# Patient Record
Sex: Male | Born: 1945
Health system: Southern US, Community
[De-identification: ages and names within clinical notes are randomized; demographics above are authoritative.]

## PROBLEM LIST (undated history)

## (undated) DIAGNOSIS — E785 Hyperlipidemia, unspecified: Secondary | ICD-10-CM

## (undated) DIAGNOSIS — I255 Ischemic cardiomyopathy: Secondary | ICD-10-CM

## (undated) DIAGNOSIS — I259 Chronic ischemic heart disease, unspecified: Secondary | ICD-10-CM

## (undated) HISTORY — DX: Hyperlipidemia, unspecified: E78.5

## (undated) HISTORY — DX: Chronic ischemic heart disease, unspecified: I25.9

## (undated) HISTORY — DX: Ischemic cardiomyopathy: I25.5

---

## 2000-08-19 ENCOUNTER — Encounter: Payer: Self-pay | Admitting: *Deleted

## 2000-08-19 ENCOUNTER — Encounter: Admission: RE | Admit: 2000-08-19 | Discharge: 2000-08-19 | Payer: Self-pay | Admitting: *Deleted

## 2004-10-16 ENCOUNTER — Inpatient Hospital Stay (HOSPITAL_COMMUNITY): Admission: RE | Admit: 2004-10-16 | Discharge: 2004-10-19 | Payer: Self-pay | Admitting: Urology

## 2011-12-25 ENCOUNTER — Ambulatory Visit: Payer: Medicare Other | Attending: Internal Medicine | Admitting: Physical Therapy

## 2011-12-25 DIAGNOSIS — M542 Cervicalgia: Secondary | ICD-10-CM | POA: Insufficient documentation

## 2011-12-25 DIAGNOSIS — IMO0001 Reserved for inherently not codable concepts without codable children: Secondary | ICD-10-CM | POA: Insufficient documentation

## 2011-12-25 DIAGNOSIS — M2569 Stiffness of other specified joint, not elsewhere classified: Secondary | ICD-10-CM | POA: Insufficient documentation

## 2011-12-25 DIAGNOSIS — R293 Abnormal posture: Secondary | ICD-10-CM | POA: Insufficient documentation

## 2011-12-29 ENCOUNTER — Ambulatory Visit: Payer: Medicare Other | Admitting: Rehabilitation

## 2012-01-02 ENCOUNTER — Ambulatory Visit: Payer: Medicare Other

## 2012-01-06 ENCOUNTER — Ambulatory Visit: Payer: Medicare Other | Admitting: Rehabilitative and Restorative Service Providers"

## 2012-01-08 ENCOUNTER — Ambulatory Visit: Payer: Medicare Other | Attending: Internal Medicine | Admitting: Physical Therapy

## 2012-01-08 DIAGNOSIS — R293 Abnormal posture: Secondary | ICD-10-CM | POA: Insufficient documentation

## 2012-01-08 DIAGNOSIS — M2569 Stiffness of other specified joint, not elsewhere classified: Secondary | ICD-10-CM | POA: Insufficient documentation

## 2012-01-08 DIAGNOSIS — IMO0001 Reserved for inherently not codable concepts without codable children: Secondary | ICD-10-CM | POA: Insufficient documentation

## 2012-01-08 DIAGNOSIS — M542 Cervicalgia: Secondary | ICD-10-CM | POA: Insufficient documentation

## 2012-01-13 ENCOUNTER — Ambulatory Visit: Payer: Medicare Other | Admitting: Physical Therapy

## 2012-01-16 ENCOUNTER — Ambulatory Visit: Payer: Medicare Other | Admitting: Physical Therapy

## 2012-01-20 ENCOUNTER — Ambulatory Visit: Payer: Medicare Other | Admitting: Physical Therapy

## 2012-01-23 ENCOUNTER — Ambulatory Visit: Payer: Medicare Other | Admitting: Physical Therapy

## 2012-01-27 ENCOUNTER — Ambulatory Visit: Payer: Medicare Other | Admitting: Physical Therapy

## 2012-01-29 ENCOUNTER — Ambulatory Visit: Payer: Medicare Other | Admitting: Physical Therapy

## 2012-02-04 ENCOUNTER — Ambulatory Visit: Payer: Medicare Other | Admitting: Physical Therapy

## 2012-02-06 ENCOUNTER — Encounter: Payer: Medicare Other | Admitting: Physical Therapy

## 2017-03-30 ENCOUNTER — Ambulatory Visit (INDEPENDENT_AMBULATORY_CARE_PROVIDER_SITE_OTHER): Payer: Medicare Other | Admitting: Orthopaedic Surgery

## 2017-03-30 DIAGNOSIS — M722 Plantar fascial fibromatosis: Secondary | ICD-10-CM | POA: Insufficient documentation

## 2017-03-30 MED ORDER — LIDOCAINE HCL 1 % IJ SOLN
1.0000 mL | INTRAMUSCULAR | Status: AC | PRN
  Administered 2017-03-30: 1 mL

## 2017-03-30 MED ORDER — METHYLPREDNISOLONE ACETATE 40 MG/ML IJ SUSP
40.0000 mg | INTRAMUSCULAR | Status: AC | PRN
  Administered 2017-03-30: 40 mg

## 2017-03-30 NOTE — Progress Notes (Signed)
   Office Visit Note   Patient: Anthony Shepherd           Date of Birth: 03/12/1946           MRN: [id number] Visit Date: 03/30/2017              Requested by: No referring provider defined for this encounter. PCP: Kirby FunkGriffin, John, MD   Assessment & Plan: Visit Diagnoses:  1. Plantar fasciitis of left foot     Plan: His clinical exam as well as signs and symptoms are consistent with plantar fasciitis of left heel. I talked about trying steroid injections. He is agreeable to this. Also showed him stretching exercises to try. All questions were encouraged and answered. He tolerated the injection well. He'll follow-up as needed but I would consider another injection into 3 months it is still bothering him.  Follow-Up Instructions: Return if symptoms worsen or fail to improve.   Orders:  Orders Placed This Encounter  Procedures  . Foot Injection   No orders of the defined types were placed in this encounter.     Procedures: Foot Inj Date/Time: 03/30/2017 11:16 AM Performed by: Kathryne HitchBLACKMAN, Sharri Loya Y Authorized by: Doneen PoissonBLACKMAN, Rashell Shambaugh Y   Condition: Plantar Fasciitis   Location: left plantar fascia muscle   Medications:  1 mL lidocaine 1 %; 40 mg methylPREDNISolone acetate 40 MG/ML     Clinical Data: No additional findings.   Subjective: No chief complaint on file. Patient is a very pleasant 71-year-old who comes in with left heel pain. It hurts on the bottom his foot to the first step in the morning when he gets out of bed is most painful. Denies a numbness and tingling in his foot and he denies any injury. He is not a diabetic. He's been working on rolling a water bottle his frozen understood try to get a better. Is been going on since about May of this year.  HPI  Review of Systems  He currently denies any headache, chest pain, short breath, fever, chills, nausea, vomiting. Objective: Vital Signs: There were no vitals taken for this visit.  Physical Exam He is  alert or 3 and in no acute distress Ortho Exam Examination of his left foot shows pain only a little plantar fascia at the heel. He does radiate the Achilles occasion but his Achilles is intact. His foot has a normal-appearing arch. His neurovascular stable his foot. His ankle range of motion is full. Specialty Comments:  No specialty comments available.  Imaging: No results found.   PMFS History: Patient Active Problem List   Diagnosis Date Noted  . Plantar fasciitis of left foot 03/30/2017   No past medical history on file.  No family history on file.  No past surgical history on file. Social History   Occupational History  . Not on file.   Social History Main Topics  . Smoking status: Not on file  . Smokeless tobacco: Not on file  . Alcohol use Not on file  . Drug use: Unknown  . Sexual activity: Not on file

## 2017-07-14 ENCOUNTER — Other Ambulatory Visit: Payer: Self-pay | Admitting: Family Medicine

## 2017-07-14 ENCOUNTER — Ambulatory Visit
Admission: RE | Admit: 2017-07-14 | Discharge: 2017-07-14 | Disposition: A | Discharge status: Home / Self Care | Payer: Worker's Compensation | Source: Ambulatory Visit | Attending: Family Medicine | Admitting: Family Medicine

## 2017-07-14 DIAGNOSIS — M7989 Other specified soft tissue disorders: Secondary | ICD-10-CM

## 2017-07-14 DIAGNOSIS — R9389 Abnormal findings on diagnostic imaging of other specified body structures: Secondary | ICD-10-CM | POA: Insufficient documentation

## 2018-05-30 ENCOUNTER — Inpatient Hospital Stay (HOSPITAL_COMMUNITY)
Admission: EM | Admit: 2018-05-30 | Discharge: 2018-06-01 | DRG: 247 | Disposition: A | Discharge status: Home / Self Care | Payer: Medicare Other | Attending: Cardiology | Admitting: Cardiology

## 2018-05-30 ENCOUNTER — Emergency Department (HOSPITAL_COMMUNITY): Payer: Medicare Other

## 2018-05-30 ENCOUNTER — Encounter (HOSPITAL_COMMUNITY): Payer: Self-pay | Admitting: Emergency Medicine

## 2018-05-30 ENCOUNTER — Other Ambulatory Visit: Payer: Self-pay

## 2018-05-30 DIAGNOSIS — I10 Essential (primary) hypertension: Secondary | ICD-10-CM | POA: Diagnosis present

## 2018-05-30 DIAGNOSIS — I214 Non-ST elevation (NSTEMI) myocardial infarction: Secondary | ICD-10-CM

## 2018-05-30 DIAGNOSIS — I255 Ischemic cardiomyopathy: Secondary | ICD-10-CM | POA: Diagnosis present

## 2018-05-30 DIAGNOSIS — R079 Chest pain, unspecified: Secondary | ICD-10-CM | POA: Diagnosis not present

## 2018-05-30 DIAGNOSIS — I2510 Atherosclerotic heart disease of native coronary artery without angina pectoris: Secondary | ICD-10-CM | POA: Diagnosis present

## 2018-05-30 DIAGNOSIS — Z955 Presence of coronary angioplasty implant and graft: Secondary | ICD-10-CM

## 2018-05-30 DIAGNOSIS — I249 Acute ischemic heart disease, unspecified: Secondary | ICD-10-CM | POA: Diagnosis present

## 2018-05-30 DIAGNOSIS — H409 Unspecified glaucoma: Secondary | ICD-10-CM | POA: Diagnosis present

## 2018-05-30 DIAGNOSIS — I240 Acute coronary thrombosis not resulting in myocardial infarction: Secondary | ICD-10-CM

## 2018-05-30 LAB — BASIC METABOLIC PANEL
Anion gap: 9 (ref 5–15)
BUN: 13 mg/dL (ref 8–23)
CALCIUM: 8.6 mg/dL — AB (ref 8.9–10.3)
CO2: 26 mmol/L (ref 22–32)
CREATININE: 1 mg/dL (ref 0.61–1.24)
Chloride: 105 mmol/L (ref 98–111)
GFR calc non Af Amer: >60 mL/min (ref >60)
GLUCOSE: 130 mg/dL — AB (ref 70–99)
Potassium: 4.1 mmol/L (ref 3.5–5.1)
Sodium: 140 mmol/L (ref 135–145)

## 2018-05-30 LAB — TROPONIN I
TROPONIN I: 0.4 ng/mL — AB (ref <0.03)
Troponin I: 8.91 ng/mL (ref <0.03)

## 2018-05-30 LAB — CBC
HCT: 45.4 % (ref 39.0–52.0)
HEMOGLOBIN: 15.1 g/dL (ref 13.0–17.0)
MCH: 33 pg (ref 26.0–34.0)
MCHC: 33.3 g/dL (ref 30.0–36.0)
MCV: 99.1 fL (ref 78.0–100.0)
PLATELETS: 223 10*3/uL (ref 150–400)
RBC: 4.58 MIL/uL (ref 4.22–5.81)
RDW: 13 % (ref 11.5–15.5)
WBC: 11 10*3/uL — AB (ref 4.0–10.5)

## 2018-05-30 LAB — CBG MONITORING, ED: GLUCOSE-CAPILLARY: 84 mg/dL (ref 70–99)

## 2018-05-30 LAB — I-STAT TROPONIN, ED: Troponin i, poc: 8.84 ng/mL (ref 0.00–0.08)

## 2018-05-30 LAB — POCT I-STAT TROPONIN I: TROPONIN I, POC: 7.4 ng/mL — AB (ref 0.00–0.08)

## 2018-05-30 MED ORDER — ACETAMINOPHEN 325 MG PO TABS
650.0000 mg | ORAL_TABLET | ORAL | Status: DC | PRN
  Administered 2018-05-31: 650 mg via ORAL
  Filled 2018-05-30: qty 2

## 2018-05-30 MED ORDER — HEPARIN (PORCINE) IN NACL 100-0.45 UNIT/ML-% IJ SOLN: 12.0000 [IU]/kg/h | INTRAMUSCULAR | Status: DC

## 2018-05-30 MED ORDER — ASPIRIN 81 MG PO CHEW
324.0000 mg | CHEWABLE_TABLET | Freq: Once | ORAL | Status: AC
  Administered 2018-05-30: 324 mg via ORAL
  Filled 2018-05-30: qty 4

## 2018-05-30 MED ORDER — HEPARIN SODIUM (PORCINE) 5000 UNIT/ML IJ SOLN: 60.0000 [IU]/kg | Freq: Once | INTRAMUSCULAR | Status: DC

## 2018-05-30 MED ORDER — ASPIRIN EC 81 MG PO TBEC
81.0000 mg | DELAYED_RELEASE_TABLET | Freq: Every day | ORAL | Status: DC
  Administered 2018-06-01: 81 mg via ORAL
  Filled 2018-05-30: qty 1

## 2018-05-30 MED ORDER — NITROGLYCERIN 2 % TD OINT
0.5000 [in_us] | TOPICAL_OINTMENT | Freq: Once | TRANSDERMAL | Status: AC
  Administered 2018-05-30: 0.5 [in_us] via TOPICAL
  Filled 2018-05-30: qty 1

## 2018-05-30 MED ORDER — NITROGLYCERIN 0.4 MG SL SUBL: 0.4000 mg | SUBLINGUAL_TABLET | SUBLINGUAL | Status: DC | PRN

## 2018-05-30 MED ORDER — HEPARIN BOLUS VIA INFUSION
4000.0000 [IU] | Freq: Once | INTRAVENOUS | Status: AC
  Administered 2018-05-30: 4000 [IU] via INTRAVENOUS
  Filled 2018-05-30: qty 4000

## 2018-05-30 MED ORDER — ONDANSETRON HCL 4 MG/2ML IJ SOLN: 4.0000 mg | Freq: Four times a day (QID) | INTRAMUSCULAR | Status: DC | PRN

## 2018-05-30 MED ORDER — HEPARIN (PORCINE) IN NACL 100-0.45 UNIT/ML-% IJ SOLN
1050.0000 [IU]/h | INTRAMUSCULAR | Status: DC
  Administered 2018-05-30: 1100 [IU]/h via INTRAVENOUS
  Filled 2018-05-30: qty 250

## 2018-05-30 NOTE — ED Notes (Signed)
Pt states that he was sitting in church today when he had a sudden sharp left chest pain which came back after eating lunch.

## 2018-05-30 NOTE — ED Notes (Signed)
Dr Dalene SeltzerSchlossman informed of troponin results 8.84

## 2018-05-30 NOTE — H&P (Signed)
Cardiology Admission History and Physical:   Patient ID: Anthony RanchCharles M Higbie; MRN: [id number]; DOB: 01/03/1946   Admission date: 05/30/2018  Primary Care Provider:  Kirby FunkGriffin, John, MD Primary Cardiologist:  No primary care provider on file   Chief Complaint:  Chest pain  History of Present Illness:   Anthony Shepherd is a 72 y.o. male with a history of hypertension and glaucoma who presents with complaints of off and on episodes of chest pain. He recalls having an episode of substernal chest pain while at church earlier today. The pain radiated to the left arm. He also was diaphoretic and nauseous. He attempted to belch to relieve his symptoms. The symptoms waxed and waned. He was able to eat lunch. The chest pain increased in intensity afterwards. He did not endorse any orthopnea, paroxysmal nocturnal dyspnea or lower extremity edema. He did not have any palpitations, presyncope or syncope.  His exercise tolerance till recently was normal. He has not had any prior episodes of chest pain.  In the ED the Troponin was 0.4. The ECG revealed ST depressions in the inferolateral leads.   Past Medical History Hypertension Glaucoma  Past Surgical History Hernia repair   Medications Prior to Admission: Prior to Admission medications   Not on File     Allergies:   No Known Allergies   Social History:   Social History   Socioeconomic History  . Marital status: Married    Spouse name: Not on file  . Number of children: Not on file  . Years of education: Not on file  . Highest education level: Not on file  Occupational History  . Not on file  Social Needs  . Financial resource strain: Not on file  . Food insecurity:    Worry: Not on file    Inability: Not on file  . Transportation needs:    Medical: Not on file    Non-medical: Not on file  Tobacco Use  . Smoking status: Not on file  Substance and Sexual Activity  . Alcohol use: Not on file  . Drug use: Not on file  . Sexual  activity: Not on file  Lifestyle  . Physical activity:    Days per week: Not on file    Minutes per session: Not on file  . Stress: Not on file  Relationships  . Social connections:    Talks on phone: Not on file    Gets together: Not on file    Attends religious service: Not on file    Active member of club or organization: Not on file    Attends meetings of clubs or organizations: Not on file    Relationship status: Not on file  . Intimate partner violence:    Fear of current or ex partner: Not on file    Emotionally abused: Not on file    Physically abused: Not on file    Forced sexual activity: Not on file  Other Topics Concern  . Not on file  Social History Narrative  . Not on file     Family History:  The patient's family history is not on file.     Review of Systems: [y] = yes, [ ]  = no   . General: Weight gain [ ] ; Weight loss [ ] ; Anorexia [ ] ; Fatigue [ ] ; Fever [ ] ; Chills [ ] ; Weakness [ ]   . Cardiac: Chest pain/pressure [y]; Resting SOB [ ] ; Exertional SOB [ ] ; Orthopnea [ ] ; Pedal Edema [ ] ; Palpitations [ ] ;  Syncope [ ] ; Presyncope [ ] ; Paroxysmal nocturnal dyspnea[ ]   . Pulmonary: Cough [ ] ; Wheezing[ ] ; Hemoptysis[ ] ; Sputum [ ] ; Snoring [ ]   . GI: Vomiting[ ] ; Dysphagia[ ] ; Melena[ ] ; Hematochezia [ ] ; Heartburn[ ] ; Abdominal pain [ ] ; Constipation [ ] ; Diarrhea [ ] ; BRBPR [ ]   . GU: Hematuria[ ] ; Dysuria [ ] ; Nocturia[ ]   . Vascular: Pain in legs with walking [ ] ; Pain in feet with lying flat [ ] ; Non-healing sores [ ] ; Stroke [ ] ; TIA [ ] ; Slurred speech [ ] ;  . Neuro: Headaches[ ] ; Vertigo[ ] ; Seizures[ ] ; Paresthesias[ ] ;Blurred vision [ ] ; Diplopia [ ] ; Vision changes [ ]   . Ortho/Skin: Arthritis [ ] ; Joint pain [ ] ; Muscle pain [ ] ; Joint swelling [ ] ; Back Pain [ ] ; Rash [ ]   . Psych: Depression[ ] ; Anxiety[ ]   . Heme: Bleeding problems [ ] ; Clotting disorders [ ] ; Anemia [ ]   . Endocrine: Diabetes [ ] ; Thyroid dysfunction[ ]      Physical  Exam/Data:   Vitals:   05/30/18 1845 05/30/18 1900 05/30/18 1915 05/30/18 1930  BP: 114/77 119/86 (!) 131/91 (!) 152/99  Pulse: 62 67 70 86  Resp: 18 18 11 16   Temp:      SpO2: 100% 98% 100% 100%  Weight:  87.1 kg 87.1 kg   Height:  5' 10.5" (1.791 m) 5\' 11"  (1.803 m)    No intake or output data in the 24 hours ending 05/30/18 1958 Filed Weights   05/30/18 1900 05/30/18 1915  Weight: 87.1 kg 87.1 kg   Body mass index is 26.78 kg/m.  General:  Well nourished, well developed, in no acute distress HEENT: normal Lymph: no adenopathy Neck: no JVD Endocrine:  No thryomegaly Vascular: No carotid bruits; FA pulses 2+ bilaterally without bruits  Cardiac:  normal S1, S2; RRR; no murmur  Lungs:  clear to auscultation bilaterally, no wheezing, rhonchi or rales  Abd: soft, nontender, no hepatomegaly  Ext: no edema Musculoskeletal:  No deformities, BUE and BLE strength normal and equal Skin: warm and dry  Neuro:  CNs 2-12 intact, no focal abnormalities noted Psych:  Normal affect    EKG:  Sinus rhythm, rate of 87 bpm, ST depressions in the inferolateral leads   Laboratory Data:  Chemistry Recent Labs  Lab 05/30/18 1527  NA 140  K 4.1  CL 105  CO2 26  GLUCOSE 130*  BUN 13  CREATININE 1.00  CALCIUM 8.6*  GFRNONAA >60  GFRAA >60  ANIONGAP 9    No results for input(s): PROT, ALBUMIN, AST, ALT, ALKPHOS, BILITOT in the last 168 hours. Hematology Recent Labs  Lab 05/30/18 1527  WBC 11.0*  RBC 4.58  HGB 15.1  HCT 45.4  MCV 99.1  MCH 33.0  MCHC 33.3  RDW 13.0  PLT 223   Cardiac Enzymes Recent Labs  Lab 05/30/18 1527  TROPONINI 0.40*   No results for input(s): TROPIPOC in the last 168 hours.  BNPNo results for input(s): BNP, PROBNP in the last 168 hours.  DDimer No results for input(s): DDIMER in the last 168 hours.  Radiology/Studies:  Dg Chest 2 View  Result Date: 05/30/2018 CLINICAL DATA:  Left chest pain EXAM: CHEST - 2 VIEW COMPARISON:  10/14/2004  FINDINGS: Heart and mediastinal contours are within normal limits. No focal opacities or effusions. No acute bony abnormality. IMPRESSION: No active cardiopulmonary disease. Electronically Signed   By: Charlett Nose M.D.   On: 05/30/2018 18:12  Assessment and Plan:   1. Non-ST elevation myocardial infarction  - The patient has the following risk factors for CAD: age and hypertension - The symptoms of chest pain are worrisome for an acute coronary syndrome (I.e NSTEMI) - The CXR did not reveal any active cardiopulmonary disease - Trend cardiac biomarkers and obtain serial ECGs - Start aspirin 81 mg daily - Check a lipid panel in the morning. - Start intravenous unfractionated heparin per pharmacy protocol - Obtain a transthoracic echocardiogram to evaluate LV systolic and diastolic function. -  Please make the patient NPO (except medications) after midnight for possible cardiac catheterization in the morning.   Severity of Illness: The appropriate patient status for this patient is INPATIENT. Inpatient status is judged to be reasonable and necessary in order to provide the required intensity of service to ensure the patient's safety. The patient's presenting symptoms, physical exam findings, and initial radiographic and laboratory data in the context of their chronic comorbidities is felt to place them at high risk for further clinical deterioration. Furthermore, it is not anticipated that the patient will be medically stable for discharge from the hospital within 2 midnights of admission. The following factors support the patient status of inpatient.   " The patient's presenting symptoms include chest pain. " The physical exam findings include normal cardiac auscultation. " The initial radiographic and laboratory data are worrisome because of elevated Troponin. " The chronic co-morbidities include hypertension.   * I certify that at the point of admission it is my clinical judgment that  the patient will require inpatient hospital care spanning beyond 2 midnights from the point of admission due to high intensity of service, high risk for further deterioration and high frequency of surveillance required.*    For questions or updates, please contact [facility] Please consult [email or web address] for contact info under Cardiology/STEMI.    Signed, Lonie Peak, MD  05/30/2018 7:58 PM

## 2018-05-30 NOTE — ED Triage Notes (Signed)
Pt states he has had left sided chest pain radiating into the left upper arm since 11am. He has no associated symptoms other than belching.

## 2018-05-30 NOTE — ED Provider Notes (Signed)
[facility]   CSN: [id number] Arrival date & time: 05/30/18  1515     History   Chief Complaint Chief Complaint  Patient presents with  . Chest Pain    HPI Anthony Shepherd is a 72 y.o. male who presents the emergency department chief combine of chest pain.  The patient has no significant past medical history except for treatment for glaucoma.  Patient states that this morning at church she had an onset of left-sided chest pain with associated belching, diaphoresis, mild nausea and indigestion radiating into the left arm lasting for about 1 hour.  He states that it got better and he was able to eat lunch but later on this afternoon it returned and was much worse, 10 out of 10 with associated shortness of breath and the previously mentioned symptoms.  Patient arrived in the ER with less chest pain it is currently a 3 out of 10.  Denies a history of smoking, high cholesterol, hypertension.  He has no family history of cardiac disease.  HPI  History reviewed. No pertinent past medical history.  Patient Active Problem List   Diagnosis Date Noted  . Plantar fasciitis of left foot 03/30/2017     The histories are not reviewed yet. Please review them in the "History" navigator section and refresh this SmartLink.      Home Medications    Prior to Admission medications   Not on File    Family History No family history on file.  Social History Social History   Tobacco Use  . Smoking status: Not on file  Substance Use Topics  . Alcohol use: Not on file  . Drug use: Not on file     Allergies   Patient has no known allergies.   Review of Systems Review of Systems  Ten systems reviewed and are negative for acute change, except as noted in the HPI.   Physical Exam Updated Vital Signs BP 119/86   Pulse 67   Temp 98.2 F (36.8 C)   Resp 18   Ht 5\' 11"  (1.803 m)   Wt 87.1 kg   SpO2 98%   BMI 26.78 kg/m    Physical Exam  Constitutional: He appears well-developed and well-nourished. No distress.  HENT:  Head: Normocephalic and atraumatic.  Eyes: Conjunctivae are normal. No scleral icterus.  Neck: Normal range of motion. Neck supple.  Cardiovascular: Normal rate, regular rhythm and normal heart sounds.  Pulmonary/Chest: Effort normal and breath sounds normal. No respiratory distress.  Abdominal: Soft. There is no tenderness.  Musculoskeletal: He exhibits no edema.  Neurological: He is alert.  Skin: Skin is warm and dry. He is not diaphoretic.  Psychiatric: His behavior is normal.  Nursing note and vitals reviewed.    ED Treatments / Results  Labs (all labs ordered are listed, but only abnormal results are displayed) Labs Reviewed  BASIC METABOLIC PANEL - Abnormal; Notable for the following components:      Result Value   Glucose, Bld 130 (*)    Calcium 8.6 (*)    All other components within normal limits  CBC - Abnormal; Notable for the following components:   WBC 11.0 (*)    All other components within normal limits  TROPONIN I - Abnormal; Notable for the following components:   Troponin I 0.40 (*)    All other components within normal limits  HEPARIN LEVEL (UNFRACTIONATED)  CBC  CBG MONITORING, ED  I-STAT TROPONIN, ED  EKG EKG Interpretation  Date/Time:  Sunday May 30 2018 15:26:40 EDT Ventricular Rate:  87 PR Interval:  164 QRS Duration: 86 QT Interval:  376 QTC Calculation: 452 R Axis:   -12 Text Interpretation:  Sinus rhythm with occasional Premature ventricular complexes Nonspecific ST and T wave abnormality Abnormal ECG new ST depressions as compared to prior EKG Confirmed by Rolan Bucco [phone]) on 05/30/2018 3:28:25 PM Also confirmed by Alvira Monday ([id number])  on 05/30/2018 7:13:21 PM   Radiology Dg Chest 2 View  Result Date: 05/30/2018 CLINICAL DATA:  Left chest pain EXAM: CHEST - 2 VIEW COMPARISON:  10/14/2004 FINDINGS: Heart and mediastinal  contours are within normal limits. No focal opacities or effusions. No acute bony abnormality. IMPRESSION: No active cardiopulmonary disease. Electronically Signed   By: Charlett Nose M.D.   On: 05/30/2018 18:12    Procedures .Critical Care Performed by: Arthor Captain, PA-C Authorized by: Arthor Captain, PA-C   Critical care provider statement:    Critical care time (minutes):  45   Critical care was necessary to treat or prevent imminent or life-threatening deterioration of the following conditions:  Cardiac failure   Critical care was time spent personally by me on the following activities:  Discussions with consultants, evaluation of patient's response to treatment, examination of patient, ordering and performing treatments and interventions, ordering and review of laboratory studies, ordering and review of radiographic studies, pulse oximetry, re-evaluation of patient's condition, obtaining history from patient or surrogate and review of old charts   (including critical care time)  Medications Ordered in ED Medications  heparin bolus via infusion 4,000 Units (has no administration in time range)  heparin ADULT infusion 100 units/mL (25000 units/273mL sodium chloride 0.45%) (has no administration in time range)  aspirin chewable tablet 324 mg (324 mg Oral Given 05/30/18 1928)  nitroGLYCERIN (NITROGLYN) 2 % ointment 0.5 inch (0.5 inches Topical Given 05/30/18 1933)     Initial Impression / Assessment and Plan / ED Course  I have reviewed the triage vital signs and the nursing notes.  Pertinent labs & imaging results that were available during my care of the patient were reviewed by me and considered in my medical decision making (see chart for details).  Clinical Course as of May 30 1950  Wynelle Link May 30, 2018  1949 Patient here with chest pain.The emergent differential diagnosis of chest pain includes: Acute coronary syndrome, pericarditis, aortic dissection, pulmonary embolism, tension  pneumothorax, pneumonia, and esophageal rupture.     [AH]  1949 Patient's EKG shows new ST segment depressions in the lateral leads.  Patient has an elevated troponin level as well.  I have begun the patient on heparin, aspirin and nitroglycerin.   [AH]  1951 Patient's chest x-ray reviewed by me shows no acute abnormalities.   [AH]  1951 Patient with N STEMI.  I consulted on the patient with the cardiologist to admit the patient.  He is aware of the findings and understands the diagnosis.   [AH]    Clinical Course User Index [AH] Arthor Captain, PA-C    Patient with acute NSTEMI.  Patient given heparin aspirin and Nitropaste.  I spoken with cardiology who will admit the patient.  He is stable throughout his ER visit.  Final Clinical Impressions(s) / ED Diagnoses   Final diagnoses:  NSTEMI (non-ST elevated myocardial infarction) [facility])    ED Discharge Orders    None       Arthor Captain, PA-C 05/31/18 0009    Alvira Monday, MD  05/31/18 2056  

## 2018-05-30 NOTE — Progress Notes (Signed)
ANTICOAGULATION CONSULT NOTE - Initial Consult  Pharmacy Consult for heparin Indication: chest pain/ACS  No Known Allergies  Patient Measurements: Height: 5\' 11"  (180.3 cm) Weight: 192 lb (87.1 kg) IBW/kg (Calculated) : 75.3 Heparin Dosing Weight: 87.1kg  Vital Signs: Temp: 98.2 F (36.8 C) (09/22 1525) BP: 119/86 (09/22 1900) Pulse Rate: 67 (09/22 1900)  Labs: Recent Labs    05/30/18 1527  HGB 15.1  HCT 45.4  PLT 223  CREATININE 1.00  TROPONINI 0.40*    Estimated Creatinine Clearance: 72.2 mL/min (by C-G formula based on SCr of 1 mg/dL).   Medical History: History reviewed. No pertinent past medical history.  Medications:  Infusions:  . heparin      Assessment: 71 yom presented to the ED with CP. Troponin elevated and now starting IV heparin. Baseline CBC is WNL and he is not on anticoagulation PTA.   Goal of Therapy:  Heparin level 0.3-0.7 units/ml Monitor platelets by anticoagulation protocol: Yes   Plan:  Heparin bolus 4000 units IV x 1 Heparin gtt 1100 units/hr Check an 8 hr heparin level Daily HL and CBC  Anthony Shepherd, Drake Leachachel Lynn 05/30/2018,7:16 PM

## 2018-05-31 ENCOUNTER — Encounter (HOSPITAL_COMMUNITY)
Admission: EM | Disposition: A | Discharge status: Home / Self Care | Payer: Self-pay | Source: Home / Self Care | Attending: Cardiology

## 2018-05-31 ENCOUNTER — Inpatient Hospital Stay (HOSPITAL_COMMUNITY): Payer: Medicare Other

## 2018-05-31 ENCOUNTER — Other Ambulatory Visit: Payer: Self-pay

## 2018-05-31 ENCOUNTER — Encounter (HOSPITAL_COMMUNITY): Payer: Self-pay | Admitting: *Deleted

## 2018-05-31 DIAGNOSIS — I214 Non-ST elevation (NSTEMI) myocardial infarction: Secondary | ICD-10-CM

## 2018-05-31 DIAGNOSIS — I2510 Atherosclerotic heart disease of native coronary artery without angina pectoris: Secondary | ICD-10-CM

## 2018-05-31 HISTORY — PX: CORONARY STENT INTERVENTION: CATH118234

## 2018-05-31 HISTORY — PX: LEFT HEART CATH AND CORONARY ANGIOGRAPHY: CATH118249

## 2018-05-31 HISTORY — PX: ULTRASOUND GUIDANCE FOR VASCULAR ACCESS: SHX6516

## 2018-05-31 LAB — BASIC METABOLIC PANEL
ANION GAP: 8 (ref 5–15)
BUN: 12 mg/dL (ref 8–23)
CHLORIDE: 108 mmol/L (ref 98–111)
CO2: 26 mmol/L (ref 22–32)
Calcium: 8.1 mg/dL — ABNORMAL LOW (ref 8.9–10.3)
Creatinine, Ser: 1.05 mg/dL (ref 0.61–1.24)
GFR calc Af Amer: >60 mL/min (ref >60)
Glucose, Bld: 97 mg/dL (ref 70–99)
Potassium: 3.8 mmol/L (ref 3.5–5.1)
Sodium: 142 mmol/L (ref 135–145)

## 2018-05-31 LAB — CBC
HEMATOCRIT: 40.3 % (ref 39.0–52.0)
HEMOGLOBIN: 13.6 g/dL (ref 13.0–17.0)
MCH: 33 pg (ref 26.0–34.0)
MCHC: 33.7 g/dL (ref 30.0–36.0)
MCV: 97.8 fL (ref 78.0–100.0)
Platelets: 195 10*3/uL (ref 150–400)
RBC: 4.12 MIL/uL — ABNORMAL LOW (ref 4.22–5.81)
RDW: 13.1 % (ref 11.5–15.5)
WBC: 12.5 10*3/uL — AB (ref 4.0–10.5)

## 2018-05-31 LAB — TROPONIN I
Troponin I: 8.22 ng/mL (ref <0.03)
Troponin I: 5.27 ng/mL (ref <0.03)

## 2018-05-31 LAB — LIPID PANEL
Cholesterol: 129 mg/dL (ref 0–200)
HDL: 30 mg/dL — ABNORMAL LOW (ref >40)
LDL CALC: 68 mg/dL (ref 0–99)
Total CHOL/HDL Ratio: 4.3 RATIO
Triglycerides: 156 mg/dL — ABNORMAL HIGH (ref <150)
VLDL: 31 mg/dL (ref 0–40)

## 2018-05-31 LAB — POCT ACTIVATED CLOTTING TIME
Activated Clotting Time: 279 seconds
Activated Clotting Time: 318 seconds
Activated Clotting Time: 274 seconds

## 2018-05-31 LAB — PROTIME-INR
INR: 1.18
PROTHROMBIN TIME: 14.9 s (ref 11.4–15.2)

## 2018-05-31 LAB — HEPARIN LEVEL (UNFRACTIONATED)
HEPARIN UNFRACTIONATED: 0.54 [IU]/mL (ref 0.30–0.70)
Heparin Unfractionated: 0.7 IU/mL (ref 0.30–0.70)

## 2018-05-31 SURGERY — LEFT HEART CATH AND CORONARY ANGIOGRAPHY
Anesthesia: LOCAL

## 2018-05-31 MED ORDER — TICAGRELOR 90 MG PO TABS
ORAL_TABLET | ORAL | Status: DC | PRN
  Administered 2018-05-31: 180 mg via ORAL

## 2018-05-31 MED ORDER — ALUM & MAG HYDROXIDE-SIMETH 200-200-20 MG/5ML PO SUSP: 30.0000 mL | ORAL | Status: DC | PRN

## 2018-05-31 MED ORDER — ASPIRIN 81 MG PO CHEW
81.0000 mg | CHEWABLE_TABLET | ORAL | Status: AC
  Administered 2018-05-31: 81 mg via ORAL
  Filled 2018-05-31: qty 1

## 2018-05-31 MED ORDER — ATORVASTATIN CALCIUM 80 MG PO TABS
80.0000 mg | ORAL_TABLET | Freq: Every day | ORAL | Status: DC
  Administered 2018-05-31 – 2018-06-01 (×2): 80 mg via ORAL
  Filled 2018-05-31 (×2): qty 1

## 2018-05-31 MED ORDER — SODIUM CHLORIDE 0.9% FLUSH: 3.0000 mL | Freq: Two times a day (BID) | INTRAVENOUS | Status: DC

## 2018-05-31 MED ORDER — SODIUM CHLORIDE 0.9 % WEIGHT BASED INFUSION
1.0000 mL/kg/h | INTRAVENOUS | Status: AC
  Administered 2018-05-31: 1 mL/kg/h via INTRAVENOUS

## 2018-05-31 MED ORDER — FENTANYL CITRATE (PF) 100 MCG/2ML IJ SOLN
INTRAMUSCULAR | Status: AC
  Filled 2018-05-31: qty 2

## 2018-05-31 MED ORDER — SODIUM CHLORIDE 0.9 % WEIGHT BASED INFUSION: 1.0000 mL/kg/h | INTRAVENOUS | Status: DC

## 2018-05-31 MED ORDER — MIDAZOLAM HCL 2 MG/2ML IJ SOLN
INTRAMUSCULAR | Status: AC
  Filled 2018-05-31: qty 2

## 2018-05-31 MED ORDER — FENTANYL CITRATE (PF) 100 MCG/2ML IJ SOLN
INTRAMUSCULAR | Status: DC | PRN
  Administered 2018-05-31: 25 ug via INTRAVENOUS
  Administered 2018-05-31: 50 ug via INTRAVENOUS

## 2018-05-31 MED ORDER — VERAPAMIL HCL 2.5 MG/ML IV SOLN
INTRAVENOUS | Status: AC
  Filled 2018-05-31: qty 2

## 2018-05-31 MED ORDER — TICAGRELOR 90 MG PO TABS
90.0000 mg | ORAL_TABLET | Freq: Two times a day (BID) | ORAL | Status: DC
  Administered 2018-06-01: 90 mg via ORAL
  Filled 2018-05-31 (×2): qty 1

## 2018-05-31 MED ORDER — IOHEXOL 350 MG/ML SOLN
INTRAVENOUS | Status: DC | PRN
  Administered 2018-05-31: 170 mL via INTRA_ARTERIAL

## 2018-05-31 MED ORDER — NITROGLYCERIN 1 MG/10 ML FOR IR/CATH LAB
INTRA_ARTERIAL | Status: AC
  Filled 2018-05-31: qty 10

## 2018-05-31 MED ORDER — HEART ATTACK BOUNCING BOOK
Freq: Once | Status: AC
  Administered 2018-05-31: 23:00:00
  Filled 2018-05-31: qty 1

## 2018-05-31 MED ORDER — SODIUM CHLORIDE 0.9 % IV SOLN
INTRAVENOUS | Status: AC | PRN
  Administered 2018-05-31: 999 mL/h via INTRAVENOUS

## 2018-05-31 MED ORDER — MIDAZOLAM HCL 2 MG/2ML IJ SOLN
INTRAMUSCULAR | Status: DC | PRN
  Administered 2018-05-31 (×2): 1 mg via INTRAVENOUS

## 2018-05-31 MED ORDER — HEPARIN SODIUM (PORCINE) 1000 UNIT/ML IJ SOLN
INTRAMUSCULAR | Status: DC | PRN
  Administered 2018-05-31: 3000 [IU] via INTRAVENOUS
  Administered 2018-05-31: 4500 [IU] via INTRAVENOUS
  Administered 2018-05-31: 5500 [IU] via INTRAVENOUS

## 2018-05-31 MED ORDER — SODIUM CHLORIDE 0.9% FLUSH
3.0000 mL | Freq: Two times a day (BID) | INTRAVENOUS | Status: DC
  Administered 2018-06-01 (×2): 3 mL via INTRAVENOUS

## 2018-05-31 MED ORDER — LABETALOL HCL 5 MG/ML IV SOLN: 10.0000 mg | INTRAVENOUS | Status: AC | PRN

## 2018-05-31 MED ORDER — ASPIRIN 81 MG PO CHEW: 81.0000 mg | CHEWABLE_TABLET | Freq: Every day | ORAL | Status: DC

## 2018-05-31 MED ORDER — HEPARIN SODIUM (PORCINE) 5000 UNIT/ML IJ SOLN
5000.0000 [IU] | Freq: Three times a day (TID) | INTRAMUSCULAR | Status: DC
  Administered 2018-06-01 (×2): 5000 [IU] via SUBCUTANEOUS
  Filled 2018-05-31: qty 1

## 2018-05-31 MED ORDER — HEPARIN SODIUM (PORCINE) 1000 UNIT/ML IJ SOLN
INTRAMUSCULAR | Status: AC
  Filled 2018-05-31: qty 1

## 2018-05-31 MED ORDER — NITROGLYCERIN 1 MG/10 ML FOR IR/CATH LAB
INTRA_ARTERIAL | Status: DC | PRN
  Administered 2018-05-31: 100 ug via INTRACORONARY
  Administered 2018-05-31: 200 ug via INTRACORONARY

## 2018-05-31 MED ORDER — SODIUM CHLORIDE 0.9 % IV SOLN: 250.0000 mL | INTRAVENOUS | Status: DC | PRN

## 2018-05-31 MED ORDER — HEPARIN (PORCINE) IN NACL 1000-0.9 UT/500ML-% IV SOLN
INTRAVENOUS | Status: DC | PRN
  Administered 2018-05-31 (×2): 500 mL

## 2018-05-31 MED ORDER — ONDANSETRON HCL 4 MG/2ML IJ SOLN: 4.0000 mg | Freq: Four times a day (QID) | INTRAMUSCULAR | Status: DC | PRN

## 2018-05-31 MED ORDER — HEPARIN (PORCINE) IN NACL 1000-0.9 UT/500ML-% IV SOLN
INTRAVENOUS | Status: AC
  Filled 2018-05-31: qty 1000

## 2018-05-31 MED ORDER — SODIUM CHLORIDE 0.9% FLUSH: 3.0000 mL | INTRAVENOUS | Status: DC | PRN

## 2018-05-31 MED ORDER — LIDOCAINE HCL (PF) 1 % IJ SOLN
INTRAMUSCULAR | Status: DC | PRN
  Administered 2018-05-31: 2 mL

## 2018-05-31 MED ORDER — LIDOCAINE HCL (PF) 1 % IJ SOLN
INTRAMUSCULAR | Status: AC
  Filled 2018-05-31: qty 30

## 2018-05-31 MED ORDER — TICAGRELOR 90 MG PO TABS
ORAL_TABLET | ORAL | Status: AC
  Filled 2018-05-31: qty 2

## 2018-05-31 MED ORDER — SODIUM CHLORIDE 0.9 % WEIGHT BASED INFUSION: 3.0000 mL/kg/h | INTRAVENOUS | Status: DC

## 2018-05-31 MED ORDER — ANGIOPLASTY BOOK
Freq: Once | Status: AC
  Administered 2018-05-31: 23:00:00
  Filled 2018-05-31: qty 1

## 2018-05-31 MED ORDER — SODIUM CHLORIDE 0.9 % WEIGHT BASED INFUSION
3.0000 mL/kg/h | INTRAVENOUS | Status: DC
  Administered 2018-05-31: 3 mL/kg/h via INTRAVENOUS
  Administered 2018-05-31: 1 mL/kg/h via INTRAVENOUS

## 2018-05-31 MED ORDER — ACETAMINOPHEN 325 MG PO TABS: 650.0000 mg | ORAL_TABLET | ORAL | Status: DC | PRN

## 2018-05-31 MED ORDER — HYDRALAZINE HCL 20 MG/ML IJ SOLN: 5.0000 mg | INTRAMUSCULAR | Status: AC | PRN

## 2018-05-31 MED ORDER — VERAPAMIL HCL 2.5 MG/ML IV SOLN
INTRAVENOUS | Status: DC | PRN
  Administered 2018-05-31: 10 mL via INTRA_ARTERIAL

## 2018-05-31 SURGICAL SUPPLY — 17 items
BALLN SAPPHIRE 2.5X12 (BALLOONS) ×3
BALLN SAPPHIRE ~~LOC~~ 3.75X12 (BALLOONS) ×2 IMPLANT
BALLOON SAPPHIRE 2.5X12 (BALLOONS) ×1 IMPLANT
CATH 5FR JL3.5 JR4 ANG PIG MP (CATHETERS) ×2 IMPLANT
CATH VISTA GUIDE 6FR XBLAD3.0 (CATHETERS) ×1 IMPLANT
CATH VISTA GUIDE 6FR XBLAD3.5 (CATHETERS) ×1 IMPLANT
DEVICE RAD COMP TR BAND LRG (VASCULAR PRODUCTS) ×1 IMPLANT
GLIDESHEATH SLEND A-KIT 6F 22G (SHEATH) ×1 IMPLANT
GUIDEWIRE INQWIRE 1.5J.035X260 (WIRE) ×1 IMPLANT
INQWIRE 1.5J .035X260CM (WIRE) ×3
KIT ENCORE 26 ADVANTAGE (KITS) ×1 IMPLANT
KIT HEART LEFT (KITS) ×3 IMPLANT
PACK CARDIAC CATHETERIZATION (CUSTOM PROCEDURE TRAY) ×3 IMPLANT
STENT SYNERGY DES 3.5X16 (Permanent Stent) ×1 IMPLANT
TRANSDUCER W/STOPCOCK (MISCELLANEOUS) ×3 IMPLANT
TUBING CIL FLEX 10 FLL-RA (TUBING) ×3 IMPLANT
WIRE ASAHI PROWATER 180CM (WIRE) ×1 IMPLANT

## 2018-05-31 NOTE — H&P (View-Only) (Signed)
Progress Note  Patient Name: Anthony Shepherd Date of Encounter: 05/31/2018  [facility] Kal[facility] (takes care of his wife, Eber JonesCarolyn)  Subjective   Pt feeling well this AM. No recurrent chest pain. Significant elevation in trop>>likely cardiac catheterization for definitive evaluation today.   Inpatient Medications    Scheduled Meds: . aspirin EC  81 mg Oral Daily  . atorvastatin  80 mg Oral q1800   Continuous Infusions: . heparin 1,100 Units/hr (05/30/18 2001)   PRN Meds: acetaminophen, nitroGLYCERIN, ondansetron (ZOFRAN) IV   Vital Signs    Vitals:   05/30/18 2242 05/30/18 2245 05/31/18 0509 05/31/18 0514  BP: 107/62   (!) 83/55  Pulse: 69   62  Resp: 16   16  Temp: 98.7 F (37.1 C)   (!) 97.5 F (36.4 C)  TempSrc: Oral   Oral  SpO2: 96%   96%  Weight:  87.1 kg 86.2 kg   Height:  5\' 11"  (1.803 m)     No intake or output data in the 24 hours ending 05/31/18 0609 Filed Weights   05/30/18 1915 05/30/18 2245 05/31/18 0509  Weight: 87.1 kg 87.1 kg 86.2 kg   Physical Exam   General: Well developed, well nourished, NAD Skin: Warm, dry, intact  Head: Normocephalic, atraumatic, clear, moist mucus membranes. Neck: Negative for carotid bruits. No JVD Lungs:Clear to ausculation bilaterally. No wheezes, rales, or rhonchi. Breathing is unlabored. Cardiovascular: RRR with S1 S2. No murmurs, rubs, gallops, or LV heave appreciated. Abdomen: Soft, non-tender, non-distended with normoactive bowel sounds. No obvious abdominal masses. MSK: Strength and tone appear normal for age. 5/5 in all extremities Extremities: No edema. No clubbing or cyanosis. DP/PT pulses 2+ bilaterally Neuro: Alert and oriented. No focal deficits. No facial asymmetry. MAE spontaneously. Psych: Responds to questions appropriately with normal affect.    Labs    Chemistry Recent Labs  Lab 05/30/18 1527 05/31/18 0323  NA 140 142  K 4.1 3.8  CL 105 108  CO2 26 26  GLUCOSE  130* 97  BUN 13 12  CREATININE 1.00 1.05  CALCIUM 8.6* 8.1*  GFRNONAA >60 >60  GFRAA >60 >60  ANIONGAP 9 8    Hematology Recent Labs  Lab 05/30/18 1527 05/31/18 0323  WBC 11.0* 12.5*  RBC 4.58 4.12*  HGB 15.1 13.6  HCT 45.4 40.3  MCV 99.1 97.8  MCH 33.0 33.0  MCHC 33.3 33.7  RDW 13.0 13.1  PLT 223 195   Cardiac Enzymes Recent Labs  Lab 05/30/18 1527 05/30/18 2228 05/31/18 0323  TROPONINI 0.40* 8.91* 8.22*    Recent Labs  Lab 05/30/18 1956 05/30/18 2232  TROPIPOC 8.84* 7.40*    BNPNo results for input(s): BNP, PROBNP in the last 168 hours.   DDimer No results for input(s): DDIMER in the last 168 hours.   Radiology    Dg Chest 2 View  Result Date: 05/30/2018 CLINICAL DATA:  Left chest pain EXAM: CHEST - 2 VIEW COMPARISON:  10/14/2004 FINDINGS: Heart and mediastinal contours are within normal limits. No focal opacities or effusions. No acute bony abnormality. IMPRESSION: No active cardiopulmonary disease. Electronically Signed   By: Charlett NoseKevin  Dover M.D.   On: 05/30/2018 18:12   Telemetry    05/31/18 NSR with PAC's and occasional PVC - Personally Reviewed  ECG    05/30/18 NSR with PVC's and ST depression in leads V4-V6 Personally Reviewed  Cardiac Studies   Echocardiogram: Pending   Patient Profile  72 y.o. male with a history of hypertension and glaucoma who presented to [facility] on [facility]/30[facility]18 with complaints of intermittent episodes of chest pain with mildly elevated troponin and EKG changes.   Assessment & Plan    1. NSTEMI with typical ACS symptoms: -Pt presented with intermittent episodes of chest pain with radiation to left arm with associated diaphoresis  -EKG with ST depression in inferolateral leads, no comparison  -Trop, 0.40>8.91>8.22 -CXR unremarkable -Plan for possible cardiac catheterization given typical ACS symptoms and significantly elevated troponin -Echocardiogram with pending results and no prior study for comparison -Continue ASA, high  intensity statin, no BB secondary to low BP  -Continue Hep gtt   2. Hx of HTN, not currently on antihypertensives: -Soft, 107/62>92/67>92/70 -No antihypertensives given soft BP -Monitor closely>>currently asymptomatic    3. Glaucoma: -Stable, continue current home regimem   Signed, Georgie Chard NP-C [facility] Pager: [phone] 05/31/2018, 6:09 AM     For questions or updates, please contact   Please consult [email or web address] for contact info under Cardiology/STEMI.  Personally seen and examined. Agree with above.  72 year old with hypertension, substernal chest pain, intense,  at church radiate into left arm diaphoretic nausea waxing and waning, no prior episodes.  Initial treatment troponin was minimally elevated at 0.4, currently 8.  EKG showed ST segment depression inferolateral leads, this morning biphasic T waves lateral precordial leads, personally reviewed and interpreted  Non-smoker. No early FHX.  Currently in bed, no CP, no SOB, comfortable.   GEN: Well nourished, well developed, in no acute distress  HEENT: normal  Neck: no JVD, carotid bruits, or masses Cardiac: RRR; no murmurs, rubs, or gallops,no edema  Respiratory:  clear to auscultation bilaterally, normal work of breathing GI: soft, nontender, nondistended, + BS MS: no deformity or atrophy  Skin: warm and dry, no rash Neuro:  Alert and Oriented x 3, Strength and sensation are intact Psych: euthymic mood, full affect  Non-ST elevation myocardial infarction - Cardiac catheterization.  Troponin VIII.  Risk benefits including stroke heart attack death renal impairment bleeding have been discussed. -IV heparin, aspirin, statin, lipid panel, echo pending, beta-blocker as tolerated.  Blood pressure soft this a.m.  Cardiac rehab when tolerated.  Donato Schultz, MD

## 2018-05-31 NOTE — Progress Notes (Signed)
ANTICOAGULATION CONSULT NOTE - Follow Up Consult  Pharmacy Consult for heparin Indication: NSTEMI  Labs: Recent Labs    05/30/18 1527 05/30/18 2228 05/31/18 0323  HGB 15.1  --  13.6  HCT 45.4  --  40.3  PLT 223  --  195  LABPROT  --   --  14.9  INR  --   --  1.18  HEPARINUNFRC  --   --  0.54  CREATININE 1.00  --   --   TROPONINI 0.40* 8.91*  --     Assessment/Plan:  72yo male therapeutic on heparin with initial dosing for ACS. Will continue gtt at current rate and confirm stable with additional level.   Anthony GamblesVeronda Suttyn Shepherd, PharmD, BCPS  05/31/2018,4:33 AM

## 2018-05-31 NOTE — Interval H&P Note (Signed)
Cath Lab Visit (complete for each Cath Lab visit)  Clinical Evaluation Leading to the Procedure:   ACS: Yes.    Non-ACS:    Anginal Classification: CCS IV  Anti-ischemic medical therapy: Minimal Therapy (1 class of medications)  Non-Invasive Test Results: No non-invasive testing performed  Prior CABG: No previous CABG      History and Physical Interval Note:  05/31/2018 4:17 PM  Anthony Shepherd  has presented today for surgery, with the diagnosis of positive trop  The various methods of treatment have been discussed with the patient and family. After consideration of risks, benefits and other options for treatment, the patient has consented to  Procedure(s): LEFT HEART CATH AND CORONARY ANGIOGRAPHY (N/A) Ultrasound Guidance For Vascular Access as a surgical intervention .  The patient's history has been reviewed, patient examined, no change in status, stable for surgery.  I have reviewed the patient's chart and labs.  Questions were answered to the patient's satisfaction.     Lyn RecordsHenry W Lousie Calico III

## 2018-05-31 NOTE — Progress Notes (Addendum)
   Progress Note  Patient Name: Anthony Shepherd Date of Encounter: 05/31/2018  Primary Cardiologist: New to [facility], Skains (takes care of his wife, Carolyn)  Subjective   Pt feeling well this AM. No recurrent chest pain. Significant elevation in trop>>likely cardiac catheterization for definitive evaluation today.   Inpatient Medications    Scheduled Meds: . aspirin EC  81 mg Oral Daily  . atorvastatin  80 mg Oral q1800   Continuous Infusions: . heparin 1,100 Units/hr (05/30/18 2001)   PRN Meds: acetaminophen, nitroGLYCERIN, ondansetron (ZOFRAN) IV   Vital Signs    Vitals:   05/30/18 2242 05/30/18 2245 05/31/18 0509 05/31/18 0514  BP: 107/62   (!) 83/55  Pulse: 69   62  Resp: 16   16  Temp: 98.7 F (37.1 C)   (!) 97.5 F (36.4 C)  TempSrc: Oral   Oral  SpO2: 96%   96%  Weight:  87.1 kg 86.2 kg   Height:  5' 11" (1.803 m)     No intake or output data in the 24 hours ending 05/31/18 0609 Filed Weights   05/30/18 1915 05/30/18 2245 05/31/18 0509  Weight: 87.1 kg 87.1 kg 86.2 kg   Physical Exam   General: Well developed, well nourished, NAD Skin: Warm, dry, intact  Head: Normocephalic, atraumatic, clear, moist mucus membranes. Neck: Negative for carotid bruits. No JVD Lungs:Clear to ausculation bilaterally. No wheezes, rales, or rhonchi. Breathing is unlabored. Cardiovascular: RRR with S1 S2. No murmurs, rubs, gallops, or LV heave appreciated. Abdomen: Soft, non-tender, non-distended with normoactive bowel sounds. No obvious abdominal masses. MSK: Strength and tone appear normal for age. 5/5 in all extremities Extremities: No edema. No clubbing or cyanosis. DP/PT pulses 2+ bilaterally Neuro: Alert and oriented. No focal deficits. No facial asymmetry. MAE spontaneously. Psych: Responds to questions appropriately with normal affect.    Labs    Chemistry Recent Labs  Lab 05/30/18 1527 05/31/18 0323  NA 140 142  K 4.1 3.8  CL 105 108  CO2 26 26  GLUCOSE  130* 97  BUN 13 12  CREATININE 1.00 1.05  CALCIUM 8.6* 8.1*  GFRNONAA >60 >60  GFRAA >60 >60  ANIONGAP 9 8    Hematology Recent Labs  Lab 05/30/18 1527 05/31/18 0323  WBC 11.0* 12.5*  RBC 4.58 4.12*  HGB 15.1 13.6  HCT 45.4 40.3  MCV 99.1 97.8  MCH 33.0 33.0  MCHC 33.3 33.7  RDW 13.0 13.1  PLT 223 195   Cardiac Enzymes Recent Labs  Lab 05/30/18 1527 05/30/18 2228 05/31/18 0323  TROPONINI 0.40* 8.91* 8.22*    Recent Labs  Lab 05/30/18 1956 05/30/18 2232  TROPIPOC 8.84* 7.40*    BNPNo results for input(s): BNP, PROBNP in the last 168 hours.   DDimer No results for input(s): DDIMER in the last 168 hours.   Radiology    Dg Chest 2 View  Result Date: 05/30/2018 CLINICAL DATA:  Left chest pain EXAM: CHEST - 2 VIEW COMPARISON:  10/14/2004 FINDINGS: Heart and mediastinal contours are within normal limits. No focal opacities or effusions. No acute bony abnormality. IMPRESSION: No active cardiopulmonary disease. Electronically Signed   By: Kevin  Dover M.D.   On: 05/30/2018 18:12   Telemetry    05/31/18 NSR with PAC's and occasional PVC - Personally Reviewed  ECG    05/30/18 NSR with PVC's and ST depression in leads V4-V6 Personally Reviewed  Cardiac Studies   Echocardiogram: Pending   Patient Profile       72 y.o. male with a history of hypertension and glaucoma who presented to [facility] on 05/30/18 with complaints of intermittent episodes of chest pain with mildly elevated troponin and EKG changes.   Assessment & Plan    1. NSTEMI with typical ACS symptoms: -Pt presented with intermittent episodes of chest pain with radiation to left arm with associated diaphoresis  -EKG with ST depression in inferolateral leads, no comparison  -Trop, 0.40>8.91>8.22 -CXR unremarkable -Plan for possible cardiac catheterization given typical ACS symptoms and significantly elevated troponin -Echocardiogram with pending results and no prior study for comparison -Continue ASA, high  intensity statin, no BB secondary to low BP  -Continue Hep gtt   2. Hx of HTN, not currently on antihypertensives: -Soft, 107/62>92/67>92/70 -No antihypertensives given soft BP -Monitor closely>>currently asymptomatic    3. Glaucoma: -Stable, continue current home regimem   Signed, Jill McDaniel NP-C [facility] Pager: [phone] 05/31/2018, 6:09 AM     For questions or updates, please contact   Please consult [email or web address] for contact info under Cardiology/STEMI.  Personally seen and examined. Agree with above.  72-year-old with hypertension, substernal chest pain, intense,  at church radiate into left arm diaphoretic nausea waxing and waning, no prior episodes.  Initial treatment troponin was minimally elevated at 0.4, currently 8.  EKG showed ST segment depression inferolateral leads, this morning biphasic T waves lateral precordial leads, personally reviewed and interpreted  Non-smoker. No early FHX.  Currently in bed, no CP, no SOB, comfortable.   GEN: Well nourished, well developed, in no acute distress  HEENT: normal  Neck: no JVD, carotid bruits, or masses Cardiac: RRR; no murmurs, rubs, or gallops,no edema  Respiratory:  clear to auscultation bilaterally, normal work of breathing GI: soft, nontender, nondistended, + BS MS: no deformity or atrophy  Skin: warm and dry, no rash Neuro:  Alert and Oriented x 3, Strength and sensation are intact Psych: euthymic mood, full affect  Non-ST elevation myocardial infarction - Cardiac catheterization.  Troponin VIII.  Risk benefits including stroke heart attack death renal impairment bleeding have been discussed. -IV heparin, aspirin, statin, lipid panel, echo pending, beta-blocker as tolerated.  Blood pressure soft this a.m.  Cardiac rehab when tolerated.  Mark Skains, MD     

## 2018-05-31 NOTE — Progress Notes (Signed)
ANTICOAGULATION CONSULT NOTE   Pharmacy Consult for heparin Indication: chest pain/ACS  No Known Allergies  Patient Measurements: Height: 5\' 11"  (180.3 cm) Weight: 190 lb (86.2 kg) IBW/kg (Calculated) : 75.3   Vital Signs: Temp: 97.5 F (36.4 C) (09/23 0514) Temp Source: Oral (09/23 0514) BP: 83/55 (09/23 0514) Pulse Rate: 62 (09/23 0514)  Labs: Recent Labs    05/30/18 1527 05/30/18 2228 05/31/18 0323 05/31/18 0930  HGB 15.1  --  13.6  --   HCT 45.4  --  40.3  --   PLT 223  --  195  --   LABPROT  --   --  14.9  --   INR  --   --  1.18  --   HEPARINUNFRC  --   --  0.54 0.70  CREATININE 1.00  --  1.05  --   TROPONINI 0.40* 8.91* 8.22* 5.27*    Estimated Creatinine Clearance: 68.7 mL/min (by C-G formula based on SCr of 1.05 mg/dL).    Assessment: 72 yo male on heparin for NSTEMI. Plans noted for cath -heparin level on the high end of goal at 1100 units/hr  Goal of Therapy:  Heparin level 0.3-0.7 units/ml Monitor platelets by anticoagulation protocol: Yes   Plan:  -Decrease heparin to 1050 units/hr -Daily CBC and heparin level  -follow plans post cath  Harland GermanAndrew Kandi Brusseau, PharmD Clinical Pharmacist Please check Amion for pharmacy contact number

## 2018-06-01 ENCOUNTER — Ambulatory Visit (HOSPITAL_COMMUNITY): Payer: Medicare Other | Attending: Cardiology

## 2018-06-01 ENCOUNTER — Encounter (HOSPITAL_COMMUNITY): Payer: Self-pay | Admitting: Interventional Cardiology

## 2018-06-01 ENCOUNTER — Telehealth: Payer: Self-pay | Admitting: Cardiology

## 2018-06-01 ENCOUNTER — Ambulatory Visit (HOSPITAL_COMMUNITY): Payer: Medicare Other

## 2018-06-01 DIAGNOSIS — H409 Unspecified glaucoma: Secondary | ICD-10-CM

## 2018-06-01 DIAGNOSIS — I10 Essential (primary) hypertension: Secondary | ICD-10-CM

## 2018-06-01 DIAGNOSIS — I240 Acute coronary thrombosis not resulting in myocardial infarction: Secondary | ICD-10-CM

## 2018-06-01 DIAGNOSIS — I255 Ischemic cardiomyopathy: Secondary | ICD-10-CM

## 2018-06-01 DIAGNOSIS — R079 Chest pain, unspecified: Secondary | ICD-10-CM

## 2018-06-01 LAB — ECHOCARDIOGRAM COMPLETE
HEIGHTINCHES: 71 in
Weight: 3083.2 oz

## 2018-06-01 LAB — ECHOCARDIOGRAM LIMITED
Height: 71 in
WEIGHTICAEL: 3083.2 [oz_av]

## 2018-06-01 LAB — BASIC METABOLIC PANEL
ANION GAP: 7 (ref 5–15)
BUN: 12 mg/dL (ref 8–23)
CALCIUM: 8.1 mg/dL — AB (ref 8.9–10.3)
CO2: 26 mmol/L (ref 22–32)
Chloride: 107 mmol/L (ref 98–111)
Creatinine, Ser: 0.91 mg/dL (ref 0.61–1.24)
Glucose, Bld: 100 mg/dL — ABNORMAL HIGH (ref 70–99)
POTASSIUM: 3.7 mmol/L (ref 3.5–5.1)
Sodium: 140 mmol/L (ref 135–145)

## 2018-06-01 LAB — CBC
HEMATOCRIT: 40.1 % (ref 39.0–52.0)
HEMOGLOBIN: 13.2 g/dL (ref 13.0–17.0)
MCH: 32.5 pg (ref 26.0–34.0)
MCHC: 32.9 g/dL (ref 30.0–36.0)
MCV: 98.8 fL (ref 78.0–100.0)
Platelets: 195 10*3/uL (ref 150–400)
RBC: 4.06 MIL/uL — AB (ref 4.22–5.81)
RDW: 13.2 % (ref 11.5–15.5)
WBC: 11.9 10*3/uL — ABNORMAL HIGH (ref 4.0–10.5)

## 2018-06-01 MED ORDER — APIXABAN 5 MG PO TABS: 5.0000 mg | ORAL_TABLET | Freq: Two times a day (BID) | ORAL | 3 refills | Status: DC

## 2018-06-01 MED ORDER — NITROGLYCERIN 0.4 MG SL SUBL: 0.4000 mg | SUBLINGUAL_TABLET | SUBLINGUAL | 3 refills | Status: DC | PRN

## 2018-06-01 MED ORDER — PERFLUTREN LIPID MICROSPHERE
1.0000 mL | INTRAVENOUS | Status: AC | PRN
  Administered 2018-06-01: 2 mL via INTRAVENOUS
  Filled 2018-06-01: qty 10

## 2018-06-01 MED ORDER — ASPIRIN 81 MG PO TBEC: 81.0000 mg | DELAYED_RELEASE_TABLET | Freq: Every day | ORAL | 3 refills | Status: DC

## 2018-06-01 MED ORDER — ATORVASTATIN CALCIUM 80 MG PO TABS: 80.0000 mg | ORAL_TABLET | Freq: Every day | ORAL | 3 refills | Status: DC

## 2018-06-01 MED ORDER — CLOPIDOGREL BISULFATE 75 MG PO TABS: 75.0000 mg | ORAL_TABLET | Freq: Every day | ORAL | 6 refills | Status: DC

## 2018-06-01 MED ORDER — TICAGRELOR 90 MG PO TABS: 90.0000 mg | ORAL_TABLET | Freq: Two times a day (BID) | ORAL | 3 refills | Status: DC

## 2018-06-01 MED ORDER — TICAGRELOR 90 MG PO TABS: 90.0000 mg | ORAL_TABLET | Freq: Two times a day (BID) | ORAL | 0 refills | Status: DC

## 2018-06-01 MED ORDER — METOPROLOL SUCCINATE ER 25 MG PO TB24
25.0000 mg | ORAL_TABLET | Freq: Every day | ORAL | Status: DC
  Administered 2018-06-01: 25 mg via ORAL
  Filled 2018-06-01: qty 1

## 2018-06-01 MED ORDER — METOPROLOL SUCCINATE ER 25 MG PO TB24: 25.0000 mg | ORAL_TABLET | Freq: Every day | ORAL | 3 refills | Status: DC

## 2018-06-01 MED FILL — BRILINTA 90 MG TABLET: 90 | 30 days supply | Qty: 60 | Fill #0

## 2018-06-01 MED FILL — ATORVASTATIN CALCIUM 80 MG: 80 | 90 days supply | Qty: 90 | Fill #0

## 2018-06-01 MED FILL — NITROGLYCERIN 0.4 MG TAB SL: 0.4 | 25 days supply | Qty: 25 | Fill #0

## 2018-06-01 MED FILL — ASPIRIN LOW DOSE 81 MG TBEC: 81 | 90 days supply | Qty: 90 | Fill #0

## 2018-06-01 MED FILL — METOPROLOL SUCCINATE ER 25: 25 | 30 days supply | Qty: 30 | Fill #0

## 2018-06-01 NOTE — Care Management Note (Signed)
Case Management Note  Patient Details  Name: Anthony Shepherd MRN: [id number] Date of Birth: 02/17/1946  Subjective/Objective:  Pt presented for NStemi- post cath plan for home on Brilinta. Benefits Check completed.                  Action/Plan: Pt will receive medications from [facility] prior to transition home. No further needs from CM at this time.   Expected Discharge Date:  06/01/18               Expected Discharge Plan:  Home/Self Care  In-House Referral:  NA  Discharge planning Services  CM Consult  Post Acute Care Choice:  NA Choice offered to:  NA  DME Arranged:  N/A DME Agency:  NA  HH Arranged:  NA HH Agency:  NA  Status of Service:  Completed, signed off  If discussed at Long Length of Stay Meetings, dates discussed:    Additional Comments:  Anthony Shepherd, Anthony Culbertson Kaye, RN 06/01/2018, 3:06 PM

## 2018-06-01 NOTE — Progress Notes (Signed)
TR BAND REMOVAL  LOCATION:    right radial  DEFLATED PER PROTOCOL:    Yes.    TIME BAND OFF / DRESSING APPLIED:    2230   SITE UPON ARRIVAL:    Level 0  SITE AFTER BAND REMOVAL:    Level 0  CIRCULATION SENSATION AND MOVEMENT:    Within Normal Limits   Yes.    COMMENTS:   Pt.tolerated procedure well ; 

## 2018-06-01 NOTE — Progress Notes (Signed)
  Echocardiogram 2D Echocardiogram has been performed.  Anthony Shepherd 06/01/2018, 11:08 AM

## 2018-06-01 NOTE — Progress Notes (Signed)
   Limited echo report as follows: Study Conclusions  - Left ventricle: Possible early forming thrombus in LV apex seen   best on apical 3 chamber view. Systolic function was moderately   to severely reduced. The estimated ejection fraction was 35%.   There is akinesis of the midanteroseptal and inferoseptal   myocardium. There is akinesis of the apicalanterior and apical   myocardium.  Discussed results with Dr. Herbie BaltimoreHarding who recommended apixaban 5mg  BID for LV thrombus. Given need for ongoing anticoagulation, he additionally recommended transition to plavix for DAPT to minimize bleeding risk. Patient was updated on findings and plan. He will take brilinta tonight and transition to plavix starting 06/02/18 with a loading dose of 300mg , followed by 75mg  daily thereafter. Follow-up has been scheduled with Dr. Anne FuSkains 06/07/18.   Beatriz StallionKrista M. Kroeger, PA-C

## 2018-06-01 NOTE — Care Management (Signed)
06-01-18 BENEFIT CHECK:  # 1.   [facility]  @  [employer] #  [id number]  BRILINTA  90 MG BID COVER- YES CO-PAY- $ 38.70 TIER-  2 DRUG PRIOR APPROVAL-  NO  PREFERRED PHARMACY : YES [facility], [facility] AND [facility]

## 2018-06-01 NOTE — Progress Notes (Addendum)
Progress Note  Patient Name: Anthony Shepherd Date of Encounter: 06/01/2018  Primary Cardiologist: Donato Schultz, MD   Subjective   Feels great, no complaints, no chest pain, no shortness of breath.  Minimal chest soreness post dilatation of stent.  Wife in room, Anthony Shepherd.  I take care of her as well.  She has cardiomyopathy.  Inpatient Medications    Scheduled Meds: . aspirin EC  81 mg Oral Daily  . atorvastatin  80 mg Oral q1800  . heparin  5,000 Units Subcutaneous Q8H  . sodium chloride flush  3 mL Intravenous Q12H  . ticagrelor  90 mg Oral BID   Continuous Infusions: . sodium chloride     PRN Meds: sodium chloride, acetaminophen, alum & mag hydroxide-simeth, nitroGLYCERIN, ondansetron (ZOFRAN) IV, sodium chloride flush   Vital Signs    Vitals:   05/31/18 1824 05/31/18 2000 05/31/18 2153 06/01/18 0637  BP: 100/75  107/75 121/68  Pulse: 68 70 79 89  Resp: 14 13  19   Temp:   98 F (36.7 C) 98 F (36.7 C)  TempSrc:   Oral Oral  SpO2: 96% 96% 97% 98%  Weight:    87.4 kg  Height:        Intake/Output Summary (Last 24 hours) at 06/01/2018 0749 Last data filed at 06/01/2018 5621 Gross per 24 hour  Intake 1876.24 ml  Output 1925 ml  Net -48.76 ml   Filed Weights   05/30/18 2245 05/31/18 0509 06/01/18 0637  Weight: 87.1 kg 86.2 kg 87.4 kg    Telemetry    No adverse arrhythmias, normal sinus rhythm, occasional PVCs noted- Personally Reviewed  ECG    Sinus rhythm biphasic T waves anterior lateral precordial leads.- Personally Reviewed  Physical Exam   GEN: No acute distress.   Neck: No JVD Cardiac: RRR, no murmurs, rubs, or gallops.  Cath site normal Respiratory: Clear to auscultation bilaterally. GI: Soft, nontender, non-distended  MS: No edema; No deformity. Neuro:  Nonfocal  Psych: Normal affect   Labs    Chemistry Recent Labs  Lab 05/30/18 1527 05/31/18 0323 06/01/18 0448  NA 140 142 140  K 4.1 3.8 3.7  CL 105 108 107  CO2 26 26 26     GLUCOSE 130* 97 100*  BUN 13 12 12   CREATININE 1.00 1.05 0.91  CALCIUM 8.6* 8.1* 8.1*  GFRNONAA >60 >60 >60  GFRAA >60 >60 >60  ANIONGAP 9 8 7      Hematology Recent Labs  Lab 05/30/18 1527 05/31/18 0323 06/01/18 0448  WBC 11.0* 12.5* 11.9*  RBC 4.58 4.12* 4.06*  HGB 15.1 13.6 13.2  HCT 45.4 40.3 40.1  MCV 99.1 97.8 98.8  MCH 33.0 33.0 32.5  MCHC 33.3 33.7 32.9  RDW 13.0 13.1 13.2  PLT 223 195 195    Cardiac Enzymes Recent Labs  Lab 05/30/18 1527 05/30/18 2228 05/31/18 0323 05/31/18 0930  TROPONINI 0.40* 8.91* 8.22* 5.27*    Recent Labs  Lab 05/30/18 1956 05/30/18 2232  TROPIPOC 8.84* 7.40*     BNPNo results for input(s): BNP, PROBNP in the last 168 hours.   DDimer No results for input(s): DDIMER in the last 168 hours.   Radiology    Dg Chest 2 View  Result Date: 05/30/2018 CLINICAL DATA:  Left chest pain EXAM: CHEST - 2 VIEW COMPARISON:  10/14/2004 FINDINGS: Heart and mediastinal contours are within normal limits. No focal opacities or effusions. No acute bony abnormality. IMPRESSION: No active cardiopulmonary disease. Electronically Signed   By:  Charlett NoseKevin  Dover M.D.   On: 05/30/2018 18:12    Cardiac Studies   Cardiac catheterization 05/31/2018:  -95% mid LAD stenosis with successful DES synergy 16.3 0.5 mm stent.  -Normal left ventricular size and function EF 65%, LVEDP 8  -Circumflex with mild luminal irregularities but no high-grade obstruction.  Normal right coronary.  Patient Profile     72 y.o. male with non-ST elevation myocardial infarction LAD DES placed  Assessment & Plan    Non-ST elevation myocardial infarction-troponin 8.9 - LAD DES placed - Aspirin and Brilinta for 12 months okay to switch to Plavix after 2 to 3 months if necessary. -Cardiac rehabilitation. -Continue high intensity statin atorvastatin 80. -We will discharge on low-dose beta-blocker in setting of recent MI.  Blood pressure should be able to tolerate.  Most recent  121/68.  Pulse 89.  Occasional PVCs noted on telemetry.  Elevated blood pressure without diagnosis of hypertension -Blood pressures here have been excellent.  Glaucoma - Stable home regimen.  Discharge home.  1 to 2-week transition of care follow-up with APP.  If he does not get echocardiogram prior to discharge, we can always do this as an outpatient.  For questions or updates, please contact [facility] Please consult [email or web address] for contact info under        Signed, Donato SchultzMark Shamari Lofquist, MD  06/01/2018, 7:49 AM

## 2018-06-01 NOTE — Telephone Encounter (Signed)
The pt is being discharged from the hospital today. We will call tomorrow. 

## 2018-06-01 NOTE — Telephone Encounter (Signed)
TOC Patient-  Please call Patient-  Pt has an appointment with Nada BoozerLaura Ingold on 06-17-18.

## 2018-06-01 NOTE — Discharge Instructions (Signed)
PLEASE REMEMBER TO BRING ALL OF YOUR MEDICATIONS TO EACH OF YOUR FOLLOW-UP OFFICE VISITS.  PLEASE ATTEND ALL SCHEDULED FOLLOW-UP APPOINTMENTS.   Activity: Increase activity slowly as tolerated. You may shower, but no soaking baths (or swimming) for 1 week. No driving for 24 hours. No lifting over 5 lbs for 1 week. No sexual activity for 1 week.   You May Return to Work: in 1 week (if applicable)  Wound Care: You may wash cath site gently with soap and water. Keep cath site clean and dry. If you notice pain, swelling, bleeding or pus at your cath site, please call [phone].  ___________________________________________________________________________________  There is evidence of a small clot in one of the chambers in your heart. You would benefit from being on a blood thinner going forward. Starting tomorrow, please take apixaban 5 mg BID.  Given the need for a blood thinner, we will change your brilinta to plavix going forward. You may take your brilinta tonight, then stop this medication. Starting tomorrow 06/02/18, please take plavix 300mg  (4 75mg  tablets). Then on 06/03/18, you will start taking plavix 75mg  daily.   Please monitor your urine and stools for signs of bleeding. If you fall and hit your head, please present directly to the emergency room for further evaluation to make sure you do not have any bleeding in your brain.

## 2018-06-01 NOTE — Discharge Summary (Addendum)
Discharge Summary    Patient ID: Anthony Shepherd,  MRN: [id number], DOB/AGE: September 21, 1945 72 y.o.  Admit date: 05/30/2018 Discharge date: 06/01/2018  Primary Care Provider: Kirby Funk Primary Cardiologist: Donato Schultz, MD  Discharge Diagnoses    Principal Problem:   NSTEMI (non-ST elevated myocardial infarction) [facility]) Active Problems:   Acute coronary syndrome [facility])   Essential hypertension   Glaucoma   Acute thrombus of left ventricle (HCC)   Ischemic cardiomyopathy   Allergies No Known Allergies  Diagnostic Studies/Procedures    Left heart catheterization 05/31/18:  A stent was successfully placed.    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  Short left main without significant obstruction  Circumflex with luminal irregularities in the proximal vessel but no high-grade obstruction.  Dominant normal right coronary.  Normal left ventricular size.  LVEDP 8 mmHg.  Successful drug-eluting stent implantation mid LAD segment reducing the 95% stenosis to 0% with TIMI grade III flow.  Synergy 16 x 3.5 mm stent postdilated to 3.75 mm at the distal margin was deployed.  RECOMMENDATIONS:   Aspirin and Brilinta for 12 months.  Would be okay to switch to Plavix after 2 to 3 months assuming no complications/side effects related to Brilinta.  Aggressive risk factor modification.  Probable discharge in a.m. if uneventful course  Recommend dual antiplatelet therapy with Aspirin 81mg  daily and Ticagrelor 90mg  twice daily long-term (beyond 12 months) because of ACS presentation. _____________   History of Present Illness     72 y.o. male with a history of hypertension and glaucoma who presented with complaints of off and on episodes of chest pain. He recalled having an episode of substernal chest pain while at church earlier 05/30/18. The pain radiated to the left arm. He also was diaphoretic and nauseous. He attempted to belch to relieve his  symptoms. The symptoms waxed and waned. He was able to eat lunch. The chest pain increased in intensity afterwards. He did not endorse any orthopnea, paroxysmal nocturnal dyspnea or lower extremity edema. He did not have any palpitations, presyncope or syncope.  His exercise tolerance till recently was normal. He has not had any prior episodes of chest pain.  In the ED the Troponin was 0.4. The ECG revealed ST depressions in the inferolateral leads.   Hospital Course     Consultants: None   1. NSTEMI: patient presented with chest pain. Found to have elevated troponins, peaked at 8.91 and trended down. EKG with STD in inferolateral leads. He was maintained on a heparin gtt overnight and underwent LHC on 05/31/18 which revealed 95% stenosis of the mid LAD proximal to a fusiform aneurysm managed with PCI/DES, without significant disease in the remainder of his coronary arteries. He was recommended for DAPT with aspirin and brilinta for 12 months and aggressive risk factor modifications. Cardiac rehabilitation was ordered. Cr and Hgb stable post catheterization. Echo revealed early LV thrombus and given need for anticoagulation, decision was made to transition to plavix and ASA for DAPT.  - Will transition to plavix at discharge. Patient instructed to take 300mg  starting 06/02/18, followed by 75mg  daily thereafter.  - Continue Aspirin - may need to consider discontinuation of aspirin after 1 month given need for anticoagulation.  - Continue metoprolol and statin   2. Ischemic cardiomyopathy: echo revealed EF 35-40%, G1DD, and mild MR and TR. He was started on metoprolol XL 25mg  daily. Unfortunately BP would not allow addition of ACEi/ARB.  - Continue metoprolol XL  for now - Consider adding ACEi/ARB outpatient if BP will tolerate - Patient provided instructions on maintaining a low salt diet  - Will need repeat echo in 3 months to monitor for improvement.  3. LV thrombus: noted to have evidence of  early thrombus on echo this admission. Discussed findings with Dr. Herbie Baltimore who recommended apixaban 5mg  BID for management.  - Continue apixaban - Will defer to Dr. Anne Fu on length of therapy.   4. HTN: BP stable. Started on metoprolol for risk management given NSTEMI - Continue metoprolol   5. HLD: LDL 68; triglycerides 156 - Continue atorvastatin  6. Glaucoma: stable - Continue home eye drop regimen   _____________  Discharge Vitals Blood pressure 117/77, pulse 80, temperature 97.9 F (36.6 C), temperature source Oral, resp. rate 15, height 5\' 11"  (1.803 m), weight 87.4 kg, SpO2 99 %.  Filed Weights   05/30/18 2245 05/31/18 0509 06/01/18 0637  Weight: 87.1 kg 86.2 kg 87.4 kg    Labs & Radiologic Studies    CBC Recent Labs    05/31/18 0323 06/01/18 0448  WBC 12.5* 11.9*  HGB 13.6 13.2  HCT 40.3 40.1  MCV 97.8 98.8  PLT 195 195   Basic Metabolic Panel Recent Labs    45/40/98 0323 06/01/18 0448  NA 142 140  K 3.8 3.7  CL 108 107  CO2 26 26  GLUCOSE 97 100*  BUN 12 12  CREATININE 1.05 0.91  CALCIUM 8.1* 8.1*   Liver Function Tests No results for input(s): AST, ALT, ALKPHOS, BILITOT, PROT, ALBUMIN in the last 72 hours. No results for input(s): LIPASE, AMYLASE in the last 72 hours. Cardiac Enzymes Recent Labs    05/30/18 2228 05/31/18 0323 05/31/18 0930  TROPONINI 8.91* 8.22* 5.27*   BNP Invalid input(s): POCBNP D-Dimer No results for input(s): DDIMER in the last 72 hours. Hemoglobin A1C No results for input(s): HGBA1C in the last 72 hours. Fasting Lipid Panel Recent Labs    05/31/18 0323  CHOL 129  HDL 30*  LDLCALC 68  TRIG 119*  CHOLHDL 4.3   Thyroid Function Tests No results for input(s): TSH, T4TOTAL, T3FREE, THYROIDAB in the last 72 hours.  Invalid input(s): FREET3 _____________  Dg Chest 2 View  Result Date: 05/30/2018 CLINICAL DATA:  Left chest pain EXAM: CHEST - 2 VIEW COMPARISON:  10/14/2004 FINDINGS: Heart and mediastinal  contours are within normal limits. No focal opacities or effusions. No acute bony abnormality. IMPRESSION: No active cardiopulmonary disease. Electronically Signed   By: Charlett Nose M.D.   On: 05/30/2018 18:12   Disposition   Patient was seen and examined by Dr. Anne Fu who deemed patient as stable for discharge. Follow-up has been arranged. Discharge medications as listed below.   Follow-up Plans & Appointments    Follow-up Information    Jake Bathe, MD Follow up on 06/07/2018.   Specialty:  Cardiology Why:  Please arrive 15 minutes early for your 3:40pm post-hospital cardiology follow-up appointment. Contact information: [address] [place] [address] [phone]          Discharge Instructions    Amb Referral to Cardiac Rehabilitation   Complete by:  As directed    works in [place] so will refer to Phase 2 there   Diagnosis:   NSTEMI Coronary Stents     Diet - low sodium heart healthy   Complete by:  As directed    Diet - low sodium heart healthy   Complete by:  As  directed    Increase activity slowly   Complete by:  As directed    Increase activity slowly   Complete by:  As directed       Discharge Medications   Allergies as of 06/01/2018   No Known Allergies     Medication List    STOP taking these medications   naproxen sodium 220 MG tablet Commonly known as:  ALEVE     TAKE these medications   apixaban 5 MG Tabs tablet Commonly known as:  ELIQUIS Take 1 tablet (5 mg total) by mouth 2 (two) times daily.   aspirin 81 MG EC tablet Take 1 tablet (81 mg total) by mouth daily. Start taking on:  06/02/2018   atorvastatin 80 MG tablet Commonly known as:  LIPITOR Take 1 tablet (80 mg total) by mouth daily at 6 PM.   bimatoprost 0.01 % Soln Commonly known as:  LUMIGAN Place 1 drop into both eyes at bedtime.   clopidogrel 75 MG tablet Commonly known as:  PLAVIX Take 1 tablet (75 mg total) by mouth daily.   Fish Oil 1000  MG Caps Take 1,000 mg by mouth daily.   hyoscyamine 0.125 MG SL tablet Commonly known as:  LEVSIN SL Place 0.125 mg under the tongue daily as needed for cramping.   metoprolol succinate 25 MG 24 hr tablet Commonly known as:  TOPROL-XL Take 1 tablet (25 mg total) by mouth daily. Start taking on:  06/02/2018   multivitamin with minerals Tabs tablet Take 1 tablet by mouth daily.   naphazoline-pheniramine 0.025-0.3 % ophthalmic solution Commonly known as:  NAPHCON-A Place 1 drop into both eyes 5 (five) times daily as needed for eye irritation (dry eyes/seasonal allergies).   nitroGLYCERIN 0.4 MG SL tablet Commonly known as:  NITROSTAT Place 1 tablet (0.4 mg total) under the tongue every 5 (five) minutes x 3 doses as needed for chest pain.   ticagrelor 90 MG Tabs tablet Commonly known as:  BRILINTA Take 1 tablet (90 mg total) by mouth 2 (two) times daily.   VITAMIN B-12 PO Take 1 tablet by mouth daily.   vitamin C 500 MG tablet Commonly known as:  ASCORBIC ACID Take 500 mg by mouth daily.   vitamin E 400 UNIT capsule Take 400 Units by mouth daily.        Aspirin prescribed at discharge?  Yes High Intensity Statin Prescribed? (Lipitor 40-80mg  or Crestor 20-40mg ): Yes Beta Blocker Prescribed? Yes For EF <40%, was ACEI/ARB Prescribed? No: BP low ADP Receptor Inhibitor Prescribed? (i.e. Plavix etc.-Includes Medically Managed Patients): Yes For EF <40%, Aldosterone Inhibitor Prescribed? No: BP low Was EF assessed during THIS hospitalization? Yes Was Cardiac Rehab II ordered? (Included Medically managed Patients): Yes   Outstanding Labs/Studies   None  Duration of Discharge Encounter   Greater than 30 minutes including physician time.  Signed, Beatriz StallionKrista M. Kroeger PA-C 06/01/2018, 5:27 PM  Personally seen and examined. Agree with above.  Chest pain-free feeling well morning of discharge.  Cardiac catheterization report revealed what appeared to be normal left  ventricular ejection fraction.  Echocardiogram however did reveal moderately reduced EF in the 40% range.  There was also concern for possible early forming left apical thrombus along akinetic anterior apical wall.  Hence, he was switched from Brilinta over to Plavix and started on apixaban 5 mg twice a day after discussion with Dr. Herbie BaltimoreHarding.  Continuing with aspirin (for possibly 30 days).  In approximately 3 months, we will repeat echocardiogram with Definity  contrast to see if thrombus has resolved. Note, he is at increased risk for bleeding with triple therapy.  Please see other details from prior note.  Donato Schultz, MD

## 2018-06-01 NOTE — Progress Notes (Signed)
CARDIAC REHAB PHASE I   PRE:  Rate/Rhythm: 76 SR  BP:  Supine:   Sitting: 117/84  Standing:    SaO2:   MODE:  Ambulation: 470 ft   POST:  Rate/Rhythm: 100 SR PACs  PVCs  BP:  Supine:   Sitting: 135/84  Standing:    SaO2: 95%RA 0805-0900 Pt walked 470 ft on RA with steady gait and no CP. Tolerated well. Discussed the importance of brilinta with stent, needs to see case manager. Reviewed NTG use, MI restrictions, ex ed, gave heart healthy diet and referred to CRP 2. Since pt works in [place] he would like referral there. Understanding voiced.   Anthony Nuttingharlene Lutisha Knoche, RN BSN  06/01/2018 8:53 AM

## 2018-06-02 NOTE — Telephone Encounter (Signed)
Patient contacted regarding discharge from [facility] on 06/01/18.  Patient understands to follow up with provider Dr. Anne Fu on 06/07/18 at 3:40 pm at [facility] location. Patient understands discharge instructions? yes Patient understands medications and regiment? yes Patient understands to bring all medications to this visit? yes  Pt had no further questions at this time.  Pt states he is feeling much better.

## 2018-06-07 ENCOUNTER — Encounter: Payer: Self-pay | Admitting: Cardiology

## 2018-06-07 ENCOUNTER — Encounter: Payer: Self-pay | Admitting: *Deleted

## 2018-06-07 ENCOUNTER — Ambulatory Visit: Payer: Medicare Other | Admitting: Cardiology

## 2018-06-07 VITALS — BP 122/80 | HR 84 | Ht 71.0 in | Wt 191.4 lb

## 2018-06-07 DIAGNOSIS — E782 Mixed hyperlipidemia: Secondary | ICD-10-CM | POA: Diagnosis not present

## 2018-06-07 DIAGNOSIS — I214 Non-ST elevation (NSTEMI) myocardial infarction: Secondary | ICD-10-CM

## 2018-06-07 DIAGNOSIS — I255 Ischemic cardiomyopathy: Secondary | ICD-10-CM | POA: Diagnosis not present

## 2018-06-07 DIAGNOSIS — I240 Acute coronary thrombosis not resulting in myocardial infarction: Secondary | ICD-10-CM

## 2018-06-07 MED ORDER — LISINOPRIL 2.5 MG PO TABS: 2.5000 mg | ORAL_TABLET | Freq: Every day | ORAL | 3 refills | Status: DC

## 2018-06-07 NOTE — Patient Instructions (Signed)
Medication Instructions:  You may stop you Fish Oil. Discontinue your Aspirin after 06/30/2018. Start Lisinopril 2.5 mg daily. Continue all other medications as listed.  Testing/Procedures: Your physician has requested that you have an echocardiogram in 3 months. Echocardiography is a painless test that uses sound waves to create images of your heart. It provides your doctor with information about the size and shape of your heart and how well your heart's chambers and valves are working. This procedure takes approximately one hour. There are no restrictions for this procedure.  Follow-Up: Follow up in 4 to 6 weeks with Sunday Spillers or Nada Boozer, NP.  If you need a refill on your cardiac medications before your next appointment, please call your pharmacy.  Thank you for choosing [facility]!!

## 2018-06-07 NOTE — Progress Notes (Signed)
Cardiology Office Note:    Date:  06/07/2018   ID:  BRYCETON HANTZ, DOB [birth year], MRN [id number]  PCP:  Kirby Funk, MD  Cardiologist:  Donato Schultz, MD  Electrophysiologist:  None   Referring MD: Kirby Funk, MD     History of Present Illness:    Anthony Shepherd is a 72 y.o. male here for follow-up of coronary artery disease post hospitalization 05/31/2018-95% stenosis in the mid LAD, synergy 16 x 3.5 mm stent.  Early left ventricular thrombus was also seen.  EF 35 to 40% noted as well, ischemic cardiomyopathy.  Has glaucoma, hypertension, was having episodic chest discomfort, episode at church on 05/30/2018 that radiated to his left arm diaphoretic, nausea.  Began to increase in intensity.  His troponin was 0.4, ST depressions were noted inferior laterally and troponin increased to approximately 9.  Had non-ST elevation myocardial infarction.  Transition to Plavix at discharge.  Echo revealed early LV thrombus and therefore anticoagulation was also started.  Aspirin will be discontinued after 1 month.  Cath EF was originally reported normal on hand-injection, however echocardiogram showed EF of 35 to 40% with mild MR and TR.  Toprol-XL was started.  No ACE inhibitor because of blood pressure.  Left ventricular thrombus-discussed with Dr. Herbie Baltimore, Eliquis 5 mg twice a day.  Atorvastatin.  Overall he is feeling quite well.  Walking almost 10 minutes a day.  Sometimes on walking he may feel subtle discomfort under his left breast.  Overall he is not experiencing any bleeding strokelike symptoms fevers chills nausea vomiting syncope.  We are stopping his fish oil since he is on triple therapy.  Previous triglyceride 156  History reviewed. No pertinent past medical history.  Past Surgical History:  Procedure Laterality Date  . CORONARY STENT INTERVENTION N/A 05/31/2018   Procedure: CORONARY STENT INTERVENTION;  Surgeon: Lyn Records, MD;  Location: [facility] INVASIVE CV LAB;  Service:  Cardiovascular;  Laterality: N/A;  . LEFT HEART CATH AND CORONARY ANGIOGRAPHY N/A 05/31/2018   Procedure: LEFT HEART CATH AND CORONARY ANGIOGRAPHY;  Surgeon: Lyn Records, MD;  Location: [facility] INVASIVE CV LAB;  Service: Cardiovascular;  Laterality: N/A;  . ULTRASOUND GUIDANCE FOR VASCULAR ACCESS  05/31/2018   Procedure: Ultrasound Guidance For Vascular Access;  Surgeon: Lyn Records, MD;  Location: [facility] INVASIVE CV LAB;  Service: Cardiovascular;;    Current Medications: Current Meds  Medication Sig  . apixaban (ELIQUIS) 5 MG TABS tablet Take 1 tablet (5 mg total) by mouth 2 (two) times daily.  Marland Kitchen aspirin EC 81 MG EC tablet Take 1 tablet (81 mg total) by mouth daily.  Marland Kitchen atorvastatin (LIPITOR) 80 MG tablet Take 1 tablet (80 mg total) by mouth daily at 6 PM.  . bimatoprost (LUMIGAN) 0.01 % SOLN Place 1 drop into both eyes at bedtime.  . clopidogrel (PLAVIX) 75 MG tablet Take 1 tablet (75 mg total) by mouth daily.  . Cyanocobalamin (VITAMIN B-12 PO) Take 1 tablet by mouth daily.  . hyoscyamine (LEVSIN SL) 0.125 MG SL tablet Place 0.125 mg under the tongue daily as needed for cramping.   . metoprolol succinate (TOPROL-XL) 25 MG 24 hr tablet Take 1 tablet (25 mg total) by mouth daily.  . Multiple Vitamin (MULTIVITAMIN WITH MINERALS) TABS tablet Take 1 tablet by mouth daily.  . naphazoline-pheniramine (NAPHCON-A) 0.025-0.3 % ophthalmic solution Place 1 drop into both eyes 5 (five) times daily as needed for eye irritation (dry eyes/seasonal allergies).  . nitroGLYCERIN (NITROSTAT) 0.4 MG SL  tablet Place 1 tablet (0.4 mg total) under the tongue every 5 (five) minutes x 3 doses as needed for chest pain.  . vitamin C (ASCORBIC ACID) 500 MG tablet Take 500 mg by mouth daily.  . vitamin E 400 UNIT capsule Take 400 Units by mouth daily.  . [DISCONTINUED] Omega-3 Fatty Acids (FISH OIL) 1000 MG CAPS Take 1,000 mg by mouth daily.     Allergies:   Patient has no known allergies.   Social History    Socioeconomic History  . Marital status: Married    Spouse name: Not on file  . Number of children: Not on file  . Years of education: Not on file  . Highest education level: Not on file  Occupational History  . Not on file  Social Needs  . Financial resource strain: Not on file  . Food insecurity:    Worry: Not on file    Inability: Not on file  . Transportation needs:    Medical: Not on file    Non-medical: Not on file  Tobacco Use  . Smoking status: Never Smoker  . Smokeless tobacco: Never Used  Substance and Sexual Activity  . Alcohol use: Never    Frequency: Never  . Drug use: Never  . Sexual activity: Not on file  Lifestyle  . Physical activity:    Days per week: Not on file    Minutes per session: Not on file  . Stress: Not on file  Relationships  . Social connections:    Talks on phone: Not on file    Gets together: Not on file    Attends religious service: Not on file    Active member of club or organization: Not on file    Attends meetings of clubs or organizations: Not on file    Relationship status: Not on file  Other Topics Concern  . Not on file  Social History Narrative  . Not on file     Family History: No early family history of CAD  ROS:   Please see the history of present illness.     All other systems reviewed and are negative.  EKGs/Labs/Other Studies Reviewed:    The following studies were reviewed today: Prior catheterization echocardiogram chart summary  EKG: No new EKG.  Prior inferolateral ST depression noted personally reviewed.  Recent Labs: 06/01/2018: BUN 12; Creatinine, Ser 0.91; Hemoglobin 13.2; Platelets 195; Potassium 3.7; Sodium 140  Recent Lipid Panel    Component Value Date/Time   CHOL 129 05/31/2018 0323   TRIG 156 (H) 05/31/2018 0323   HDL 30 (L) 05/31/2018 0323   CHOLHDL 4.3 05/31/2018 0323   VLDL 31 05/31/2018 0323   LDLCALC 68 05/31/2018 0323    Physical Exam:    VS:  BP 122/80   Pulse 84   Ht 5'  11" (1.803 m)   Wt 191 lb 6.4 oz (86.8 kg)   BMI 26.69 kg/m     Wt Readings from Last 3 Encounters:  06/07/18 191 lb 6.4 oz (86.8 kg)  06/01/18 192 lb 11.2 oz (87.4 kg)     GEN:  Well nourished, well developed in no acute distress HEENT: Normal NECK: No JVD; No carotid bruits LYMPHATICS: No lymphadenopathy CARDIAC: RRR, no murmurs, rubs, gallops RESPIRATORY:  Clear to auscultation without rales, wheezing or rhonchi  ABDOMEN: Soft, non-tender, non-distended MUSCULOSKELETAL:  No edema; No deformity  SKIN: Warm and dry NEUROLOGIC:  Alert and oriented x 3 PSYCHIATRIC:  Normal affect   ASSESSMENT:  No diagnosis found. PLAN:    In order of problems listed above:  Non-ST elevation myocardial infarction with LAD stent mid - Currently on aspirin 81 mg which will be stopped 30 days post stent placement on June 30, 2018.  Also on Plavix 75 mg once a day.  Also on Eliquis 5 mg twice a day given LV early thrombus.  We will stop fish oil to decrease risk of bleeding.  He understands the risk of bleeding with triple therapy.  So far he is feeling very good.  Walking well.  He is eager to get back to work.  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  He would also like to do cardiac rehab in [place] next to his home.  This is fine.  LV thrombus - Eliquis.  We will go ahead and order an echocardiogram with Definity contrast for 3 months from now.  If thrombus has resolved, we will stop Eliquis and resume aspirin to reinitiate dual antiplatelet therapy with aspirin and Plavix.  Ischemic cardiomyopathy - I am going to start low-dose lisinopril 2.5 mg once a day -Continue with low-dose Toprol.  Blood pressure 122/80 today.  It was soft in the hospital.  Hyperlipidemia - On high intensity Lipitor 80 mg.   Medication Adjustments/Labs and Tests Ordered: Current medicines are reviewed at length with the patient today.  Concerns regarding medicines  are outlined above.  No orders of the defined types were placed in this encounter.  No orders of the defined types were placed in this encounter.   There are no Patient Instructions on file for this visit.   Signed, Donato Schultz, MD  06/07/2018 4:20 PM    [facility]

## 2018-06-10 ENCOUNTER — Encounter: Payer: Self-pay | Admitting: *Deleted

## 2018-06-10 ENCOUNTER — Telehealth: Payer: Self-pay | Admitting: Cardiology

## 2018-06-10 ENCOUNTER — Encounter: Payer: Medicare Other | Attending: Cardiology | Admitting: *Deleted

## 2018-06-10 VITALS — Ht 71.5 in | Wt 190.5 lb

## 2018-06-10 DIAGNOSIS — Z7982 Long term (current) use of aspirin: Secondary | ICD-10-CM | POA: Diagnosis not present

## 2018-06-10 DIAGNOSIS — Z79899 Other long term (current) drug therapy: Secondary | ICD-10-CM | POA: Diagnosis not present

## 2018-06-10 DIAGNOSIS — I214 Non-ST elevation (NSTEMI) myocardial infarction: Secondary | ICD-10-CM | POA: Insufficient documentation

## 2018-06-10 DIAGNOSIS — Z955 Presence of coronary angioplasty implant and graft: Secondary | ICD-10-CM | POA: Insufficient documentation

## 2018-06-10 NOTE — Progress Notes (Signed)
Cardiac Individual Treatment Plan  Patient Details  Name: Anthony Shepherd MRN: [id number] Date of Birth: 06/06/1946 Referring Provider:     Cardiac Rehab from 06/10/2018 in [facility] Cardiac and Pulmonary Rehab  Referring Provider  Skains      Initial Encounter Date:    Cardiac Rehab from 06/10/2018 in [facility] Cardiac and Pulmonary Rehab  Date  06/10/18      Visit Diagnosis: NSTEMI (non-ST elevated myocardial infarction) [facility])  Status post coronary artery stent placement  Patient's Home Medications on Admission:  Current Outpatient Medications:  .  apixaban (ELIQUIS) 5 MG TABS tablet, Take 1 tablet (5 mg total) by mouth 2 (two) times daily., Disp: 60 tablet, Rfl: 3 .  aspirin EC 81 MG EC tablet, Take 1 tablet (81 mg total) by mouth daily., Disp: 90 tablet, Rfl: 3 .  atorvastatin (LIPITOR) 80 MG tablet, Take 1 tablet (80 mg total) by mouth daily at 6 PM., Disp: 90 tablet, Rfl: 3 .  bimatoprost (LUMIGAN) 0.01 % SOLN, Place 1 drop into both eyes at bedtime., Disp: , Rfl:  .  clopidogrel (PLAVIX) 75 MG tablet, Take 1 tablet (75 mg total) by mouth daily., Disp: 30 tablet, Rfl: 6 .  Cyanocobalamin (VITAMIN B-12 PO), Take 1 tablet by mouth daily., Disp: , Rfl:  .  hyoscyamine (LEVSIN SL) 0.125 MG SL tablet, Place 0.125 mg under the tongue daily as needed for cramping. , Disp: , Rfl:  .  lisinopril (PRINIVIL,ZESTRIL) 2.5 MG tablet, Take 1 tablet (2.5 mg total) by mouth daily., Disp: 90 tablet, Rfl: 3 .  metoprolol succinate (TOPROL-XL) 25 MG 24 hr tablet, Take 1 tablet (25 mg total) by mouth daily., Disp: 30 tablet, Rfl: 3 .  Multiple Vitamin (MULTIVITAMIN WITH MINERALS) TABS tablet, Take 1 tablet by mouth daily., Disp: , Rfl:  .  naphazoline-pheniramine (NAPHCON-A) 0.025-0.3 % ophthalmic solution, Place 1 drop into both eyes 5 (five) times daily as needed for eye irritation (dry eyes/seasonal allergies)., Disp: , Rfl:  .  nitroGLYCERIN (NITROSTAT) 0.4 MG SL tablet, Place 1 tablet (0.4 mg total)  under the tongue every 5 (five) minutes x 3 doses as needed for chest pain., Disp: 25 tablet, Rfl: 3 .  vitamin C (ASCORBIC ACID) 500 MG tablet, Take 500 mg by mouth daily., Disp: , Rfl:  .  vitamin E 400 UNIT capsule, Take 400 Units by mouth daily., Disp: , Rfl:   Past Medical History: History reviewed. No pertinent past medical history.  Tobacco Use: Social History   Tobacco Use  Smoking Status Never Smoker  Smokeless Tobacco Never Used    Labs: Recent Review Scientist, physiological    Labs for ITP Cardiac and Pulmonary Rehab Latest Ref Rng & Units 05/31/2018   Cholestrol 0 - 200 mg/dL 129   LDLCALC 0 - 99 mg/dL 68   HDL >40 mg/dL 30(L)   Trlycerides <150 mg/dL 156(H)       Exercise Target Goals: Exercise Program Goal: Individual exercise prescription set using results from initial 6 min walk test and THRR while considering  patient's activity barriers and safety.   Exercise Prescription Goal: Initial exercise prescription builds to 30-45 minutes a day of aerobic activity, 2-3 days per week.  Home exercise guidelines will be given to patient during program as part of exercise prescription that the participant will acknowledge.  Activity Barriers & Risk Stratification:   6 Minute Walk: 6 Minute Walk    Row Name 06/10/18 1337         6  Minute Walk   Phase  Initial     Distance  1400 feet     Distance % Change  0 %     Distance Feet Change  0 ft     Walk Time  6 minutes     # of Rest Breaks  0     MPH  2.65     METS  3.25     RPE  14     Perceived Dyspnea   0     VO2 Peak  11.38     Symptoms  No     Resting HR  59 bpm     Resting BP  112/68     Resting Oxygen Saturation   98 %     Max Ex. HR  107 bpm     Max Ex. BP  132/60     2 Minute Post BP  110/64        Oxygen Initial Assessment:   Oxygen Re-Evaluation:   Oxygen Discharge (Final Oxygen Re-Evaluation):   Initial Exercise Prescription: Initial Exercise Prescription - 06/10/18 1300      Date of  Initial Exercise RX and Referring Provider   Date  06/10/18    Referring Provider  Skains      Treadmill   MPH  2.6    Grade  0.5    Minutes  15    METs  3.17      Recumbant Bike   Level  4    RPM  60    Watts  34    Minutes  15    METs  3.25      NuStep   Level  3    SPM  80    Minutes  15    METs  3.25      Prescription Details   Frequency (times per week)  3    Duration  Progress to 30 minutes of continuous aerobic without signs/symptoms of physical distress      Intensity   THRR 40-80% of Max Heartrate  95-131    Ratings of Perceived Exertion  11-15    Perceived Dyspnea  0-4      Progression   Progression  Continue to progress workloads to maintain intensity without signs/symptoms of physical distress.      Resistance Training   Training Prescription  Yes    Weight  3 lb    Reps  10-15       Perform Capillary Blood Glucose checks as needed.  Exercise Prescription Changes: Exercise Prescription Changes    Row Name 06/10/18 1300             Response to Exercise   Blood Pressure (Admit)  112/68       Blood Pressure (Exercise)  132/60       Blood Pressure (Exit)  110/64       Heart Rate (Admit)  59 bpm       Heart Rate (Exercise)  107 bpm       Heart Rate (Exit)  60 bpm       Oxygen Saturation (Admit)  98 %       Rating of Perceived Exertion (Exercise)  14       Perceived Dyspnea (Exercise)  0       Symptoms  none          Exercise Comments:   Exercise Goals and Review: Exercise Goals    Row Name 06/10/18 1336  Exercise Goals   Increase Physical Activity  Yes       Intervention  Provide advice, education, support and counseling about physical activity/exercise needs.;Develop an individualized exercise prescription for aerobic and resistive training based on initial evaluation findings, risk stratification, comorbidities and participant's personal goals.       Expected Outcomes  Short Term: Attend rehab on a regular basis to  increase amount of physical activity.;Long Term: Add in home exercise to make exercise part of routine and to increase amount of physical activity.;Long Term: Exercising regularly at least 3-5 days a week.       Increase Strength and Stamina  Yes       Intervention  Provide advice, education, support and counseling about physical activity/exercise needs.;Develop an individualized exercise prescription for aerobic and resistive training based on initial evaluation findings, risk stratification, comorbidities and participant's personal goals.       Expected Outcomes  Short Term: Increase workloads from initial exercise prescription for resistance, speed, and METs.;Short Term: Perform resistance training exercises routinely during rehab and add in resistance training at home;Long Term: Improve cardiorespiratory fitness, muscular endurance and strength as measured by increased METs and functional capacity (6MWT)       Able to understand and use rate of perceived exertion (RPE) scale  Yes       Intervention  Provide education and explanation on how to use RPE scale       Expected Outcomes  Short Term: Able to use RPE daily in rehab to express subjective intensity level;Long Term:  Able to use RPE to guide intensity level when exercising independently       Able to understand and use Dyspnea scale  Yes       Intervention  Provide education and explanation on how to use Dyspnea scale       Expected Outcomes  Short Term: Able to use Dyspnea scale daily in rehab to express subjective sense of shortness of breath during exertion;Long Term: Able to use Dyspnea scale to guide intensity level when exercising independently       Knowledge and understanding of Target Heart Rate Range (THRR)  Yes       Intervention  Provide education and explanation of THRR including how the numbers were predicted and where they are located for reference       Expected Outcomes  Short Term: Able to state/look up THRR;Short Term: Able to  use daily as guideline for intensity in rehab;Long Term: Able to use THRR to govern intensity when exercising independently       Able to check pulse independently  Yes       Intervention  Provide education and demonstration on how to check pulse in carotid and radial arteries.;Review the importance of being able to check your own pulse for safety during independent exercise       Expected Outcomes  Short Term: Able to explain why pulse checking is important during independent exercise;Long Term: Able to check pulse independently and accurately       Understanding of Exercise Prescription  Yes       Intervention  Provide education, explanation, and written materials on patient's individual exercise prescription       Expected Outcomes  Short Term: Able to explain program exercise prescription;Long Term: Able to explain home exercise prescription to exercise independently          Exercise Goals Re-Evaluation :   Discharge Exercise Prescription (Final Exercise Prescription Changes): Exercise Prescription Changes - 06/10/18  1300      Response to Exercise   Blood Pressure (Admit)  112/68    Blood Pressure (Exercise)  132/60    Blood Pressure (Exit)  110/64    Heart Rate (Admit)  59 bpm    Heart Rate (Exercise)  107 bpm    Heart Rate (Exit)  60 bpm    Oxygen Saturation (Admit)  98 %    Rating of Perceived Exertion (Exercise)  14    Perceived Dyspnea (Exercise)  0    Symptoms  none       Nutrition:  Target Goals: Understanding of nutrition guidelines, daily intake of sodium <1563m, cholesterol <2022m calories 30% from fat and 7% or less from saturated fats, daily to have 5 or more servings of fruits and vegetables.  Biometrics: Pre Biometrics - 06/10/18 1335      Pre Biometrics   Height  5' 11.5" (1.816 m)    Weight  190 lb 8 oz (86.4 kg)    Waist Circumference  40.5 inches    Hip Circumference  41 inches    Waist to Hip Ratio  0.99 %    BMI (Calculated)  26.2    Single Leg  Stand  16.5 seconds        Nutrition Therapy Plan and Nutrition Goals: Nutrition Therapy & Goals - 06/10/18 1411      Intervention Plan   Intervention  Prescribe, educate and counsel regarding individualized specific dietary modifications aiming towards targeted core components such as weight, hypertension, lipid management, diabetes, heart failure and other comorbidities.;Nutrition handout(s) given to patient.    Expected Outcomes  Short Term Goal: Understand basic principles of dietary content, such as calories, fat, sodium, cholesterol and nutrients.;Short Term Goal: A plan has been developed with personal nutrition goals set during dietitian appointment.;Long Term Goal: Adherence to prescribed nutrition plan.       Nutrition Assessments: Nutrition Assessments - 06/10/18 1411      MEDFICTS Scores   Pre Score  49       Nutrition Goals Re-Evaluation:   Nutrition Goals Discharge (Final Nutrition Goals Re-Evaluation):   Psychosocial: Target Goals: Acknowledge presence or absence of significant depression and/or stress, maximize coping skills, provide positive support system. Participant is able to verbalize types and ability to use techniques and skills needed for reducing stress and depression.   Initial Review & Psychosocial Screening: Initial Psych Review & Screening - 06/10/18 1406      Initial Review   Current issues with  None Identified   didn't report any issues during Med Review. 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      Screening Interventions   Interventions  Encouraged to exercise;Program counselor  consult;To provide support and resources with identified psychosocial needs;Provide feedback about the scores to participant    Expected Outcomes  Short Term goal: Utilizing psychosocial counselor, staff and physician to assist with identification of specific Stressors or current issues interfering with healing process. Setting desired goal for each stressor or current issue identified.;Long Term Goal: Stressors or current issues are controlled or eliminated.;Short Term goal: Identification and review with participant of  any Quality of Life or Depression concerns found by scoring the questionnaire.;Long Term goal: The participant improves quality of Life and PHQ9 Scores as seen by post scores and/or verbalization of changes       Quality of Life Scores:  Quality of Life - 06/10/18 1407      Quality of Life   Select  Quality of Life      Quality of Life Scores   Health/Function Pre  29.6 %    Socioeconomic Pre  30 %    Psych/Spiritual Pre  30 %    Family Pre  30 %    GLOBAL Pre  29.82 %      Scores of 19 and below usually indicate a poorer quality of life in these areas.  A difference of  2-3 points is a clinically meaningful difference.  A difference of 2-3 points in the total score of the Quality of Life Index has been associated with significant improvement in overall quality of life, self-image, physical symptoms, and general health in studies assessing change in quality of life.  PHQ-9: Recent Review Flowsheet Data    Depression screen [name] 2/9 06/10/2018   Decreased Interest 0   Down, Depressed, Hopeless 0   PHQ - 2 Score 0   Altered sleeping 0   Tired, decreased energy 0   Change in appetite 0   Feeling bad or failure about yourself  0   Trouble concentrating 0   Moving slowly or fidgety/restless 0   Suicidal thoughts 0   PHQ-9 Score 0     Interpretation of Total Score  Total Score Depression Severity:  1-4 = Minimal depression, 5-9 = Mild depression, 10-14 = Moderate  depression, 15-19 = Moderately severe depression, 20-27 = Severe depression   Psychosocial Evaluation and Intervention:   Psychosocial Re-Evaluation:   Psychosocial Discharge (Final Psychosocial Re-Evaluation):   Vocational Rehabilitation: Provide vocational rehab assistance to qualifying candidates.   Vocational Rehab Evaluation & Intervention: Vocational Rehab - 06/10/18 1405      Initial Vocational Rehab Evaluation & Intervention   Assessment shows need for Vocational Rehabilitation  No       Education: Education Goals: Education classes will be provided on a variety of topics geared toward better understanding of heart health and risk factor modification. Participant will state understanding/return demonstration of topics presented as noted by education test scores.  Learning Barriers/Preferences: Learning Barriers/Preferences - 06/10/18 1404      Learning Barriers/Preferences   Learning Barriers  None    Learning Preferences  None       Education Topics:  AED/CPR: - Group verbal and written instruction with the use of models to demonstrate the basic use of the AED with the basic ABC's of resuscitation.   General Nutrition Guidelines/Fats and Fiber: -Group instruction provided by verbal, written material, models and posters to present the general guidelines for heart healthy nutrition. Gives an explanation and review of dietary fats and fiber.   Controlling Sodium/Reading Food Labels: -Group verbal and written material supporting the discussion of sodium use in heart healthy nutrition. Review and explanation with models, verbal and written materials for utilization of the food label.   Exercise Physiology & General Exercise Guidelines: - Group verbal and written instruction with models to review the exercise physiology of the cardiovascular system and associated critical values. Provides general exercise guidelines with specific guidelines to those with heart or  lung disease.    Aerobic Exercise & Resistance Training: - Gives group verbal and written  instruction on the various components of exercise. Focuses on aerobic and resistive training programs and the benefits of this training and how to safely progress through these programs..   Flexibility, Balance, Mind/Body Relaxation: Provides group verbal/written instruction on the benefits of flexibility and balance training, including mind/body exercise modes such as yoga, pilates and tai chi.  Demonstration and skill practice provided.   Stress and Anxiety: - Provides group verbal and written instruction about the health risks of elevated stress and causes of high stress.  Discuss the correlation between heart/lung disease and anxiety and treatment options. Review healthy ways to manage with stress and anxiety.   Depression: - Provides group verbal and written instruction on the correlation between heart/lung disease and depressed mood, treatment options, and the stigmas associated with seeking treatment.   Anatomy & Physiology of the Heart: - Group verbal and written instruction and models provide basic cardiac anatomy and physiology, with the coronary electrical and arterial systems. Review of Valvular disease and Heart Failure   Cardiac Procedures: - Group verbal and written instruction to review commonly prescribed medications for heart disease. Reviews the medication, class of the drug, and side effects. Includes the steps to properly store meds and maintain the prescription regimen. (beta blockers and nitrates)   Cardiac Medications I: - Group verbal and written instruction to review commonly prescribed medications for heart disease. Reviews the medication, class of the drug, and side effects. Includes the steps to properly store meds and maintain the prescription regimen.   Cardiac Medications II: -Group verbal and written instruction to review commonly prescribed medications for heart  disease. Reviews the medication, class of the drug, and side effects. (all other drug classes)    Go Sex-Intimacy & Heart Disease, Get SMART - Goal Setting: - Group verbal and written instruction through game format to discuss heart disease and the return to sexual intimacy. Provides group verbal and written material to discuss and apply goal setting through the application of the S.M.A.R.T. Method.   Other Matters of the Heart: - Provides group verbal, written materials and models to describe Stable Angina and Peripheral Artery. Includes description of the disease process and treatment options available to the cardiac patient.   Exercise & Equipment Safety: - Individual verbal instruction and demonstration of equipment use and safety with use of the equipment.   Cardiac Rehab from 06/10/2018 in [facility] Cardiac and Pulmonary Rehab  Date  06/10/18  Educator  [facility]  Instruction Review Code  1- Verbalizes Understanding      Infection Prevention: - Provides verbal and written material to individual with discussion of infection control including proper hand washing and proper equipment cleaning during exercise session.   Cardiac Rehab from 06/10/2018 in [facility] Cardiac and Pulmonary Rehab  Date  06/10/18  Educator  [facility]  Instruction Review Code  1- Verbalizes Understanding      Falls Prevention: - Provides verbal and written material to individual with discussion of falls prevention and safety.   Cardiac Rehab from 06/10/2018 in [facility] Cardiac and Pulmonary Rehab  Date  06/10/18  Educator  [facility]  Instruction Review Code  1- Verbalizes Understanding      Diabetes: - Individual verbal and written instruction to review signs/symptoms of diabetes, desired ranges of glucose level fasting, after meals and with exercise. Acknowledge that pre and post exercise glucose checks will be done for 3 sessions at entry of program.   Know Your Numbers and Risk Factors: -Group verbal and written instruction about  important numbers in  your health.  Discussion of what are risk factors and how they play a role in the disease process.  Review of Cholesterol, Blood Pressure, Diabetes, and BMI and the role they play in your overall health.   Sleep Hygiene: -Provides group verbal and written instruction about how sleep can affect your health.  Define sleep hygiene, discuss sleep cycles and impact of sleep habits. Review good sleep hygiene tips.    Other: -Provides group and verbal instruction on various topics (see comments)   Knowledge Questionnaire Score: Knowledge Questionnaire Score - 06/10/18 1404      Knowledge Questionnaire Score   Pre Score  23/26   correct answers reviewed with with Ronalee Belts. focus on exercise and Angina      Core Components/Risk Factors/Patient Goals at Admission: Personal Goals and Risk Factors at Admission - 06/10/18 1402      Core Components/Risk Factors/Patient Goals on Admission    Weight Management  Yes;Weight Loss    Intervention  Weight Management: Develop a combined nutrition and exercise program designed to reach desired caloric intake, while maintaining appropriate intake of nutrient and fiber, sodium and fats, and appropriate energy expenditure required for the weight goal.;Weight Management: Provide education and appropriate resources to help participant work on and attain dietary goals.;Weight Management/Obesity: Establish reasonable short term and long term weight goals.    Admit Weight  190 lb 8 oz (86.4 kg)    Goal Weight: Short Term  185 lb (83.9 kg)    Goal Weight: Long Term  180 lb (81.6 kg)    Expected Outcomes  Short Term: Continue to assess and modify interventions until short term weight is achieved;Long Term: Adherence to nutrition and physical activity/exercise program aimed toward attainment of established weight goal;Weight Loss: Understanding of general recommendations for a balanced deficit meal plan, which promotes 1-2 lb weight loss per week and  includes a negative energy balance of [phone] kcal/d;Understanding recommendations for meals to include 15-35% energy as protein, 25-35% energy from fat, 35-60% energy from carbohydrates, less than 249m of dietary cholesterol, 20-35 gm of total fiber daily;Understanding of distribution of calorie intake throughout the day with the consumption of 4-5 meals/snacks    Hypertension  Yes    Intervention  Provide education on lifestyle modifcations including regular physical activity/exercise, weight management, moderate sodium restriction and increased consumption of fresh fruit, vegetables, and low fat dairy, alcohol moderation, and smoking cessation.;Monitor prescription use compliance.    Expected Outcomes  Short Term: Continued assessment and intervention until BP is < 140/929mHG in hypertensive participants. < 130/8094mG in hypertensive participants with diabetes, heart failure or chronic kidney disease.;Long Term: Maintenance of blood pressure at goal levels.    Lipids  Yes    Intervention  Provide education and support for participant on nutrition & aerobic/resistive exercise along with prescribed medications to achieve LDL <94m36mDL >40mg74m Expected Outcomes  Short Term: Participant states understanding of desired cholesterol values and is compliant with medications prescribed. Participant is following exercise prescription and nutrition guidelines.;Long Term: Cholesterol controlled with medications as prescribed, with individualized exercise RX and with personalized nutrition plan. Value goals: LDL < 94mg,21m > 40 mg.       Core Components/Risk Factors/Patient Goals Review:    Core Components/Risk Factors/Patient Goals at Discharge (Final Review):    ITP Comments: ITP Comments    Row Name 06/10/18 1357           ITP Comments  Med Review completed. Initial ITP created. Diagnosis  can be found in [facility] 9/22          Comments: Initial ITP

## 2018-06-10 NOTE — Patient Instructions (Signed)
Patient Instructions  Patient Details  Name: Anthony Shepherd MRN: [id number] Date of Birth: 01/28/1946 Referring Provider:  Jake Bathe, MD  Below are your personal goals for exercise, nutrition, and risk factors. Our goal is to help you stay on track towards obtaining and maintaining these goals. We will be discussing your progress on these goals with you throughout the program.  Initial Exercise Prescription: Initial Exercise Prescription - 06/10/18 1300      Date of Initial Exercise RX and Referring Provider   Date  06/10/18    Referring Provider  Skains      Treadmill   MPH  2.6    Grade  0.5    Minutes  15    METs  3.17      Recumbant Bike   Level  4    RPM  60    Watts  34    Minutes  15    METs  3.25      NuStep   Level  3    SPM  80    Minutes  15    METs  3.25      Prescription Details   Frequency (times per week)  3    Duration  Progress to 30 minutes of continuous aerobic without signs/symptoms of physical distress      Intensity   THRR 40-80% of Max Heartrate  95-131    Ratings of Perceived Exertion  11-15    Perceived Dyspnea  0-4      Progression   Progression  Continue to progress workloads to maintain intensity without signs/symptoms of physical distress.      Resistance Training   Training Prescription  Yes    Weight  3 lb    Reps  10-15       Exercise Goals: Frequency: Be able to perform aerobic exercise two to three times per week in program working toward 2-5 days per week of home exercise.  Intensity: Work with a perceived exertion of 11 (fairly light) - 15 (hard) while following your exercise prescription.  We will make changes to your prescription with you as you progress through the program.   Duration: Be able to do 30 to 45 minutes of continuous aerobic exercise in addition to a 5 minute warm-up and a 5 minute cool-down routine.   Nutrition Goals: Your personal nutrition goals will be established when you do your nutrition  analysis with the dietician.  The following are general nutrition guidelines to follow: Cholesterol < 200mg /day Sodium < 1500mg /day Fiber: Men over 50 yrs - 30 grams per day  Personal Goals: Personal Goals and Risk Factors at Admission - 06/10/18 1402      Core Components/Risk Factors/Patient Goals on Admission    Weight Management  Yes;Weight Loss    Intervention  Weight Management: Develop a combined nutrition and exercise program designed to reach desired caloric intake, while maintaining appropriate intake of nutrient and fiber, sodium and fats, and appropriate energy expenditure required for the weight goal.;Weight Management: Provide education and appropriate resources to help participant work on and attain dietary goals.;Weight Management/Obesity: Establish reasonable short term and long term weight goals.    Admit Weight  190 lb 8 oz (86.4 kg)    Goal Weight: Short Term  185 lb (83.9 kg)    Goal Weight: Long Term  180 lb (81.6 kg)    Expected Outcomes  Short Term: Continue to assess and modify interventions until short term weight is achieved;Long Term: Adherence  to nutrition and physical activity/exercise program aimed toward attainment of established weight goal;Weight Loss: Understanding of general recommendations for a balanced deficit meal plan, which promotes 1-2 lb weight loss per week and includes a negative energy balance of [phone] kcal/d;Understanding recommendations for meals to include 15-35% energy as protein, 25-35% energy from fat, 35-60% energy from carbohydrates, less than 200mg  of dietary cholesterol, 20-35 gm of total fiber daily;Understanding of distribution of calorie intake throughout the day with the consumption of 4-5 meals/snacks    Hypertension  Yes    Intervention  Provide education on lifestyle modifcations including regular physical activity/exercise, weight management, moderate sodium restriction and increased consumption of fresh fruit, vegetables, and low  fat dairy, alcohol moderation, and smoking cessation.;Monitor prescription use compliance.    Expected Outcomes  Short Term: Continued assessment and intervention until BP is < 140/87mm HG in hypertensive participants. < 130/27mm HG in hypertensive participants with diabetes, heart failure or chronic kidney disease.;Long Term: Maintenance of blood pressure at goal levels.    Lipids  Yes    Intervention  Provide education and support for participant on nutrition & aerobic/resistive exercise along with prescribed medications to achieve LDL 70mg , HDL >40mg .    Expected Outcomes  Short Term: Participant states understanding of desired cholesterol values and is compliant with medications prescribed. Participant is following exercise prescription and nutrition guidelines.;Long Term: Cholesterol controlled with medications as prescribed, with individualized exercise RX and with personalized nutrition plan. Value goals: LDL < 70mg , HDL > 40 mg.       Tobacco Use Initial Evaluation: Social History   Tobacco Use  Smoking Status Never Smoker  Smokeless Tobacco Never Used    Exercise Goals and Review: Exercise Goals    Row Name 06/10/18 1336             Exercise Goals   Increase Physical Activity  Yes       Intervention  Provide advice, education, support and counseling about physical activity/exercise needs.;Develop an individualized exercise prescription for aerobic and resistive training based on initial evaluation findings, risk stratification, comorbidities and participant's personal goals.       Expected Outcomes  Short Term: Attend rehab on a regular basis to increase amount of physical activity.;Long Term: Add in home exercise to make exercise part of routine and to increase amount of physical activity.;Long Term: Exercising regularly at least 3-5 days a week.       Increase Strength and Stamina  Yes       Intervention  Provide advice, education, support and counseling about physical  activity/exercise needs.;Develop an individualized exercise prescription for aerobic and resistive training based on initial evaluation findings, risk stratification, comorbidities and participant's personal goals.       Expected Outcomes  Short Term: Increase workloads from initial exercise prescription for resistance, speed, and METs.;Short Term: Perform resistance training exercises routinely during rehab and add in resistance training at home;Long Term: Improve cardiorespiratory fitness, muscular endurance and strength as measured by increased METs and functional capacity ( )       Able to understand and use rate of perceived exertion (RPE) scale  Yes       Intervention  Provide education and explanation on how to use RPE scale       Expected Outcomes  Short Term: Able to use RPE daily in rehab to express subjective intensity level;Long Term:  Able to use RPE to guide intensity level when exercising independently       Able to understand and use  Dyspnea scale  Yes       Intervention  Provide education and explanation on how to use Dyspnea scale       Expected Outcomes  Short Term: Able to use Dyspnea scale daily in rehab to express subjective sense of shortness of breath during exertion;Long Term: Able to use Dyspnea scale to guide intensity level when exercising independently       Knowledge and understanding of Target Heart Rate Range (THRR)  Yes       Intervention  Provide education and explanation of THRR including how the numbers were predicted and where they are located for reference       Expected Outcomes  Short Term: Able to state/look up THRR;Short Term: Able to use daily as guideline for intensity in rehab;Long Term: Able to use THRR to govern intensity when exercising independently       Able to check pulse independently  Yes       Intervention  Provide education and demonstration on how to check pulse in carotid and radial arteries.;Review the importance of being able to check your  own pulse for safety during independent exercise       Expected Outcomes  Short Term: Able to explain why pulse checking is important during independent exercise;Long Term: Able to check pulse independently and accurately       Understanding of Exercise Prescription  Yes       Intervention  Provide education, explanation, and written materials on patient's individual exercise prescription       Expected Outcomes  Short Term: Able to explain program exercise prescription;Long Term: Able to explain home exercise prescription to exercise independently          Copy of goals given to participant.

## 2018-06-10 NOTE — Telephone Encounter (Signed)
Pt aware OK to have flu shot.

## 2018-06-10 NOTE — Progress Notes (Signed)
Daily Session Note  Patient Details  Name: Anthony Shepherd MRN: [id number] Date of Birth: August 21, 1946 Referring Provider:     Cardiac Rehab from 06/10/2018 in [facility]  Referring Provider  Skains      Encounter Date: 06/10/2018  Check In: Session Check In - 06/10/18 1355      Check-In   Supervising physician immediately available to respond to emergencies  See telemetry face sheet for immediately available ER MD    Location  [facility]-Cardiac & Pulmonary Rehab    Staff Present  Renita Papa, RN Vickki Hearing, BA, ACSM CEP, Exercise Physiologist    Warm-up and Cool-down  Not performed (comment)   med review   Resistance Training Performed  Yes    VAD Patient?  No    PAD/SET Patient?  No      Pain Assessment   Currently in Pain?  No/denies        Exercise Prescription Changes - 06/10/18 1300      Response to Exercise   Blood Pressure (Admit)  112/68    Blood Pressure (Exercise)  132/60    Blood Pressure (Exit)  110/64    Heart Rate (Admit)  59 bpm    Heart Rate (Exercise)  107 bpm    Heart Rate (Exit)  60 bpm    Oxygen Saturation (Admit)  98 %    Rating of Perceived Exertion (Exercise)  14    Perceived Dyspnea (Exercise)  0    Symptoms  none       Social History   Tobacco Use  Smoking Status Never Smoker  Smokeless Tobacco Never Used    Goals Met:  Proper associated with RPD/PD & O2 Sat Exercise tolerated well No report of cardiac concerns or symptoms Strength training completed today  Goals Unmet:  Not Applicable  Comments: Med Review completed.   Dr. Emily Filbert is Medical Director for [place] and [facility].

## 2018-06-10 NOTE — Telephone Encounter (Signed)
New Message          Patient is calling today to check to see if he can take a flu shot next week (06/17/2018). Patient states he is on blood thinners. Pls call and advise.

## 2018-06-16 ENCOUNTER — Encounter: Payer: Self-pay | Admitting: *Deleted

## 2018-06-16 DIAGNOSIS — I214 Non-ST elevation (NSTEMI) myocardial infarction: Secondary | ICD-10-CM

## 2018-06-16 DIAGNOSIS — Z955 Presence of coronary angioplasty implant and graft: Secondary | ICD-10-CM

## 2018-06-16 NOTE — Progress Notes (Signed)
Cardiac Individual Treatment Plan  Patient Details  Name: Anthony Shepherd MRN: [id number] Date of Birth: 1945/09/10 Referring Provider:     Cardiac Rehab from 06/10/2018 in [facility] Cardiac and Pulmonary Rehab  Referring Provider  Skains      Initial Encounter Date:    Cardiac Rehab from 06/10/2018 in [facility] Cardiac and Pulmonary Rehab  Date  06/10/18      Visit Diagnosis: NSTEMI (non-ST elevated myocardial infarction) [facility])  Status post coronary artery stent placement  Patient's Home Medications on Admission:  Current Outpatient Medications:  .  apixaban (ELIQUIS) 5 MG TABS tablet, Take 1 tablet (5 mg total) by mouth 2 (two) times daily., Disp: 60 tablet, Rfl: 3 .  aspirin EC 81 MG EC tablet, Take 1 tablet (81 mg total) by mouth daily., Disp: 90 tablet, Rfl: 3 .  atorvastatin (LIPITOR) 80 MG tablet, Take 1 tablet (80 mg total) by mouth daily at 6 PM., Disp: 90 tablet, Rfl: 3 .  bimatoprost (LUMIGAN) 0.01 % SOLN, Place 1 drop into both eyes at bedtime., Disp: , Rfl:  .  clopidogrel (PLAVIX) 75 MG tablet, Take 1 tablet (75 mg total) by mouth daily., Disp: 30 tablet, Rfl: 6 .  Cyanocobalamin (VITAMIN B-12 PO), Take 1 tablet by mouth daily., Disp: , Rfl:  .  hyoscyamine (LEVSIN SL) 0.125 MG SL tablet, Place 0.125 mg under the tongue daily as needed for cramping. , Disp: , Rfl:  .  lisinopril (PRINIVIL,ZESTRIL) 2.5 MG tablet, Take 1 tablet (2.5 mg total) by mouth daily., Disp: 90 tablet, Rfl: 3 .  metoprolol succinate (TOPROL-XL) 25 MG 24 hr tablet, Take 1 tablet (25 mg total) by mouth daily., Disp: 30 tablet, Rfl: 3 .  Multiple Vitamin (MULTIVITAMIN WITH MINERALS) TABS tablet, Take 1 tablet by mouth daily., Disp: , Rfl:  .  naphazoline-pheniramine (NAPHCON-A) 0.025-0.3 % ophthalmic solution, Place 1 drop into both eyes 5 (five) times daily as needed for eye irritation (dry eyes/seasonal allergies)., Disp: , Rfl:  .  nitroGLYCERIN (NITROSTAT) 0.4 MG SL tablet, Place 1 tablet (0.4 mg total)  under the tongue every 5 (five) minutes x 3 doses as needed for chest pain., Disp: 25 tablet, Rfl: 3 .  vitamin C (ASCORBIC ACID) 500 MG tablet, Take 500 mg by mouth daily., Disp: , Rfl:  .  vitamin E 400 UNIT capsule, Take 400 Units by mouth daily., Disp: , Rfl:   Past Medical History: No past medical history on file.  Tobacco Use: Social History   Tobacco Use  Smoking Status Never Smoker  Smokeless Tobacco Never Used    Labs: Recent Review Scientist, physiological    Labs for ITP Cardiac and Pulmonary Rehab Latest Ref Rng & Units 05/31/2018   Cholestrol 0 - 200 mg/dL 129   LDLCALC 0 - 99 mg/dL 68   HDL >40 mg/dL 30(L)   Trlycerides <150 mg/dL 156(H)       Exercise Target Goals: Exercise Program Goal: Individual exercise prescription set using results from initial 6 min walk test and THRR while considering  patient's activity barriers and safety.   Exercise Prescription Goal: Initial exercise prescription builds to 30-45 minutes a day of aerobic activity, 2-3 days per week.  Home exercise guidelines will be given to patient during program as part of exercise prescription that the participant will acknowledge.  Activity Barriers & Risk Stratification:   6 Minute Walk: 6 Minute Walk    Row Name 06/10/18 1337         6 Minute  Walk   Phase  Initial     Distance  1400 feet     Distance % Change  0 %     Distance Feet Change  0 ft     Walk Time  6 minutes     # of Rest Breaks  0     MPH  2.65     METS  3.25     RPE  14     Perceived Dyspnea   0     VO2 Peak  11.38     Symptoms  No     Resting HR  59 bpm     Resting BP  112/68     Resting Oxygen Saturation   98 %     Max Ex. HR  107 bpm     Max Ex. BP  132/60     2 Minute Post BP  110/64        Oxygen Initial Assessment:   Oxygen Re-Evaluation:   Oxygen Discharge (Final Oxygen Re-Evaluation):   Initial Exercise Prescription: Initial Exercise Prescription - 06/10/18 1300      Date of Initial Exercise RX and  Referring Provider   Date  06/10/18    Referring Provider  Skains      Treadmill   MPH  2.6    Grade  0.5    Minutes  15    METs  3.17      Recumbant Bike   Level  4    RPM  60    Watts  34    Minutes  15    METs  3.25      NuStep   Level  3    SPM  80    Minutes  15    METs  3.25      Prescription Details   Frequency (times per week)  3    Duration  Progress to 30 minutes of continuous aerobic without signs/symptoms of physical distress      Intensity   THRR 40-80% of Max Heartrate  95-131    Ratings of Perceived Exertion  11-15    Perceived Dyspnea  0-4      Progression   Progression  Continue to progress workloads to maintain intensity without signs/symptoms of physical distress.      Resistance Training   Training Prescription  Yes    Weight  3 lb    Reps  10-15       Perform Capillary Blood Glucose checks as needed.  Exercise Prescription Changes: Exercise Prescription Changes    Row Name 06/10/18 1300             Response to Exercise   Blood Pressure (Admit)  112/68       Blood Pressure (Exercise)  132/60       Blood Pressure (Exit)  110/64       Heart Rate (Admit)  59 bpm       Heart Rate (Exercise)  107 bpm       Heart Rate (Exit)  60 bpm       Oxygen Saturation (Admit)  98 %       Rating of Perceived Exertion (Exercise)  14       Perceived Dyspnea (Exercise)  0       Symptoms  none          Exercise Comments:   Exercise Goals and Review: Exercise Goals    Row Name 06/10/18 1336  Exercise Goals   Increase Physical Activity  Yes       Intervention  Provide advice, education, support and counseling about physical activity/exercise needs.;Develop an individualized exercise prescription for aerobic and resistive training based on initial evaluation findings, risk stratification, comorbidities and participant's personal goals.       Expected Outcomes  Short Term: Attend rehab on a regular basis to increase amount of  physical activity.;Long Term: Add in home exercise to make exercise part of routine and to increase amount of physical activity.;Long Term: Exercising regularly at least 3-5 days a week.       Increase Strength and Stamina  Yes       Intervention  Provide advice, education, support and counseling about physical activity/exercise needs.;Develop an individualized exercise prescription for aerobic and resistive training based on initial evaluation findings, risk stratification, comorbidities and participant's personal goals.       Expected Outcomes  Short Term: Increase workloads from initial exercise prescription for resistance, speed, and METs.;Short Term: Perform resistance training exercises routinely during rehab and add in resistance training at home;Long Term: Improve cardiorespiratory fitness, muscular endurance and strength as measured by increased METs and functional capacity (6MWT)       Able to understand and use rate of perceived exertion (RPE) scale  Yes       Intervention  Provide education and explanation on how to use RPE scale       Expected Outcomes  Short Term: Able to use RPE daily in rehab to express subjective intensity level;Long Term:  Able to use RPE to guide intensity level when exercising independently       Able to understand and use Dyspnea scale  Yes       Intervention  Provide education and explanation on how to use Dyspnea scale       Expected Outcomes  Short Term: Able to use Dyspnea scale daily in rehab to express subjective sense of shortness of breath during exertion;Long Term: Able to use Dyspnea scale to guide intensity level when exercising independently       Knowledge and understanding of Target Heart Rate Range (THRR)  Yes       Intervention  Provide education and explanation of THRR including how the numbers were predicted and where they are located for reference       Expected Outcomes  Short Term: Able to state/look up THRR;Short Term: Able to use daily as  guideline for intensity in rehab;Long Term: Able to use THRR to govern intensity when exercising independently       Able to check pulse independently  Yes       Intervention  Provide education and demonstration on how to check pulse in carotid and radial arteries.;Review the importance of being able to check your own pulse for safety during independent exercise       Expected Outcomes  Short Term: Able to explain why pulse checking is important during independent exercise;Long Term: Able to check pulse independently and accurately       Understanding of Exercise Prescription  Yes       Intervention  Provide education, explanation, and written materials on patient's individual exercise prescription       Expected Outcomes  Short Term: Able to explain program exercise prescription;Long Term: Able to explain home exercise prescription to exercise independently          Exercise Goals Re-Evaluation :   Discharge Exercise Prescription (Final Exercise Prescription Changes): Exercise Prescription Changes - 06/10/18  1300      Response to Exercise   Blood Pressure (Admit)  112/68    Blood Pressure (Exercise)  132/60    Blood Pressure (Exit)  110/64    Heart Rate (Admit)  59 bpm    Heart Rate (Exercise)  107 bpm    Heart Rate (Exit)  60 bpm    Oxygen Saturation (Admit)  98 %    Rating of Perceived Exertion (Exercise)  14    Perceived Dyspnea (Exercise)  0    Symptoms  none       Nutrition:  Target Goals: Understanding of nutrition guidelines, daily intake of sodium <1573m, cholesterol <2027m calories 30% from fat and 7% or less from saturated fats, daily to have 5 or more servings of fruits and vegetables.  Biometrics: Pre Biometrics - 06/10/18 1335      Pre Biometrics   Height  5' 11.5" (1.816 m)    Weight  190 lb 8 oz (86.4 kg)    Waist Circumference  40.5 inches    Hip Circumference  41 inches    Waist to Hip Ratio  0.99 %    BMI (Calculated)  26.2    Single Leg Stand  16.5  seconds        Nutrition Therapy Plan and Nutrition Goals: Nutrition Therapy & Goals - 06/10/18 1411      Intervention Plan   Intervention  Prescribe, educate and counsel regarding individualized specific dietary modifications aiming towards targeted core components such as weight, hypertension, lipid management, diabetes, heart failure and other comorbidities.;Nutrition handout(s) given to patient.    Expected Outcomes  Short Term Goal: Understand basic principles of dietary content, such as calories, fat, sodium, cholesterol and nutrients.;Short Term Goal: A plan has been developed with personal nutrition goals set during dietitian appointment.;Long Term Goal: Adherence to prescribed nutrition plan.       Nutrition Assessments: Nutrition Assessments - 06/10/18 1411      MEDFICTS Scores   Pre Score  49       Nutrition Goals Re-Evaluation:   Nutrition Goals Discharge (Final Nutrition Goals Re-Evaluation):   Psychosocial: Target Goals: Acknowledge presence or absence of significant depression and/or stress, maximize coping skills, provide positive support system. Participant is able to verbalize types and ability to use techniques and skills needed for reducing stress and depression.   Initial Review & Psychosocial Screening: Initial Psych Review & Screening - 06/10/18 1406      Initial Review   Current issues with  None Identified   didn't report any issues during Med Review. 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      Screening Interventions   Interventions  Encouraged to exercise;Program counselor consult;To provide  support and resources with identified psychosocial needs;Provide feedback about the scores to participant    Expected Outcomes  Short Term goal: Utilizing psychosocial counselor, staff and physician to assist with identification of specific Stressors or current issues interfering with healing process. Setting desired goal for each stressor or current issue identified.;Long Term Goal: Stressors or current issues are controlled or eliminated.;Short Term goal: Identification and review with participant of  any Quality of Life or Depression concerns found by scoring the questionnaire.;Long Term goal: The participant improves quality of Life and PHQ9 Scores as seen by post scores and/or verbalization of changes       Quality of Life Scores:  Quality of Life - 06/10/18 1407      Quality of Life   Select  Quality of Life      Quality of Life Scores   Health/Function Pre  29.6 %    Socioeconomic Pre  30 %    Psych/Spiritual Pre  30 %    Family Pre  30 %    GLOBAL Pre  29.82 %      Scores of 19 and below usually indicate a poorer quality of life in these areas.  A difference of  2-3 points is a clinically meaningful difference.  A difference of 2-3 points in the total score of the Quality of Life Index has been associated with significant improvement in overall quality of life, self-image, physical symptoms, and general health in studies assessing change in quality of life.  PHQ-9: Recent Review Flowsheet Data    Depression screen [facility] 2/9 06/10/2018   Decreased Interest 0   Down, Depressed, Hopeless 0   PHQ - 2 Score 0   Altered sleeping 0   Tired, decreased energy 0   Change in appetite 0   Feeling bad or failure about yourself  0   Trouble concentrating 0   Moving slowly or fidgety/restless 0   Suicidal thoughts 0   PHQ-9 Score 0     Interpretation of Total Score  Total Score Depression Severity:  1-4 = Minimal depression, 5-9 = Mild depression, 10-14 = Moderate depression, 15-19 =  Moderately severe depression, 20-27 = Severe depression   Psychosocial Evaluation and Intervention:   Psychosocial Re-Evaluation:   Psychosocial Discharge (Final Psychosocial Re-Evaluation):   Vocational Rehabilitation: Provide vocational rehab assistance to qualifying candidates.   Vocational Rehab Evaluation & Intervention: Vocational Rehab - 06/10/18 1405      Initial Vocational Rehab Evaluation & Intervention   Assessment shows need for Vocational Rehabilitation  No       Education: Education Goals: Education classes will be provided on a variety of topics geared toward better understanding of heart health and risk factor modification. Participant will state understanding/return demonstration of topics presented as noted by education test scores.  Learning Barriers/Preferences: Learning Barriers/Preferences - 06/10/18 1404      Learning Barriers/Preferences   Learning Barriers  None    Learning Preferences  None       Education Topics:  AED/CPR: - Group verbal and written instruction with the use of models to demonstrate the basic use of the AED with the basic ABC's of resuscitation.   General Nutrition Guidelines/Fats and Fiber: -Group instruction provided by verbal, written material, models and posters to present the general guidelines for heart healthy nutrition. Gives an explanation and review of dietary fats and fiber.   Controlling Sodium/Reading Food Labels: -Group verbal and written material supporting the discussion of sodium use in heart healthy nutrition. Review and explanation with models, verbal and written materials for utilization of the food label.   Exercise Physiology & General Exercise Guidelines: - Group verbal and written instruction with models to review the exercise physiology of the cardiovascular system and associated critical values. Provides general exercise guidelines with specific guidelines to those with heart or lung disease.     Aerobic Exercise & Resistance Training: - Gives group verbal and written  instruction on the various components of exercise. Focuses on aerobic and resistive training programs and the benefits of this training and how to safely progress through these programs..   Flexibility, Balance, Mind/Body Relaxation: Provides group verbal/written instruction on the benefits of flexibility and balance training, including mind/body exercise modes such as yoga, pilates and tai chi.  Demonstration and skill practice provided.   Stress and Anxiety: - Provides group verbal and written instruction about the health risks of elevated stress and causes of high stress.  Discuss the correlation between heart/lung disease and anxiety and treatment options. Review healthy ways to manage with stress and anxiety.   Depression: - Provides group verbal and written instruction on the correlation between heart/lung disease and depressed mood, treatment options, and the stigmas associated with seeking treatment.   Anatomy & Physiology of the Heart: - Group verbal and written instruction and models provide basic cardiac anatomy and physiology, with the coronary electrical and arterial systems. Review of Valvular disease and Heart Failure   Cardiac Procedures: - Group verbal and written instruction to review commonly prescribed medications for heart disease. Reviews the medication, class of the drug, and side effects. Includes the steps to properly store meds and maintain the prescription regimen. (beta blockers and nitrates)   Cardiac Medications I: - Group verbal and written instruction to review commonly prescribed medications for heart disease. Reviews the medication, class of the drug, and side effects. Includes the steps to properly store meds and maintain the prescription regimen.   Cardiac Medications II: -Group verbal and written instruction to review commonly prescribed medications for heart disease.  Reviews the medication, class of the drug, and side effects. (all other drug classes)    Go Sex-Intimacy & Heart Disease, Get SMART - Goal Setting: - Group verbal and written instruction through game format to discuss heart disease and the return to sexual intimacy. Provides group verbal and written material to discuss and apply goal setting through the application of the S.M.A.R.T. Method.   Other Matters of the Heart: - Provides group verbal, written materials and models to describe Stable Angina and Peripheral Artery. Includes description of the disease process and treatment options available to the cardiac patient.   Exercise & Equipment Safety: - Individual verbal instruction and demonstration of equipment use and safety with use of the equipment.   Cardiac Rehab from 06/10/2018 in [facility] Cardiac and Pulmonary Rehab  Date  06/10/18  Educator  [facility]  Instruction Review Code  1- Verbalizes Understanding      Infection Prevention: - Provides verbal and written material to individual with discussion of infection control including proper hand washing and proper equipment cleaning during exercise session.   Cardiac Rehab from 06/10/2018 in [facility] Cardiac and Pulmonary Rehab  Date  06/10/18  Educator  [facility]  Instruction Review Code  1- Verbalizes Understanding      Falls Prevention: - Provides verbal and written material to individual with discussion of falls prevention and safety.   Cardiac Rehab from 06/10/2018 in [facility] Cardiac and Pulmonary Rehab  Date  06/10/18  Educator  [facility]  Instruction Review Code  1- Verbalizes Understanding      Diabetes: - Individual verbal and written instruction to review signs/symptoms of diabetes, desired ranges of glucose level fasting, after meals and with exercise. Acknowledge that pre and post exercise glucose checks will be done for 3 sessions at entry of program.   Know Your Numbers and Risk Factors: -Group verbal and written instruction about important  numbers in  your health.  Discussion of what are risk factors and how they play a role in the disease process.  Review of Cholesterol, Blood Pressure, Diabetes, and BMI and the role they play in your overall health.   Sleep Hygiene: -Provides group verbal and written instruction about how sleep can affect your health.  Define sleep hygiene, discuss sleep cycles and impact of sleep habits. Review good sleep hygiene tips.    Other: -Provides group and verbal instruction on various topics (see comments)   Knowledge Questionnaire Score: Knowledge Questionnaire Score - 06/10/18 1404      Knowledge Questionnaire Score   Pre Score  23/26   correct answers reviewed with with Ronalee Belts. focus on exercise and Angina      Core Components/Risk Factors/Patient Goals at Admission: Personal Goals and Risk Factors at Admission - 06/10/18 1402      Core Components/Risk Factors/Patient Goals on Admission    Weight Management  Yes;Weight Loss    Intervention  Weight Management: Develop a combined nutrition and exercise program designed to reach desired caloric intake, while maintaining appropriate intake of nutrient and fiber, sodium and fats, and appropriate energy expenditure required for the weight goal.;Weight Management: Provide education and appropriate resources to help participant work on and attain dietary goals.;Weight Management/Obesity: Establish reasonable short term and long term weight goals.    Admit Weight  190 lb 8 oz (86.4 kg)    Goal Weight: Short Term  185 lb (83.9 kg)    Goal Weight: Long Term  180 lb (81.6 kg)    Expected Outcomes  Short Term: Continue to assess and modify interventions until short term weight is achieved;Long Term: Adherence to nutrition and physical activity/exercise program aimed toward attainment of established weight goal;Weight Loss: Understanding of general recommendations for a balanced deficit meal plan, which promotes 1-2 lb weight loss per week and includes a  negative energy balance of [phone] kcal/d;Understanding recommendations for meals to include 15-35% energy as protein, 25-35% energy from fat, 35-60% energy from carbohydrates, less than 29m of dietary cholesterol, 20-35 gm of total fiber daily;Understanding of distribution of calorie intake throughout the day with the consumption of 4-5 meals/snacks    Hypertension  Yes    Intervention  Provide education on lifestyle modifcations including regular physical activity/exercise, weight management, moderate sodium restriction and increased consumption of fresh fruit, vegetables, and low fat dairy, alcohol moderation, and smoking cessation.;Monitor prescription use compliance.    Expected Outcomes  Short Term: Continued assessment and intervention until BP is < 140/950mHG in hypertensive participants. < 130/8012mG in hypertensive participants with diabetes, heart failure or chronic kidney disease.;Long Term: Maintenance of blood pressure at goal levels.    Lipids  Yes    Intervention  Provide education and support for participant on nutrition & aerobic/resistive exercise along with prescribed medications to achieve LDL <36m22mDL >40mg32m Expected Outcomes  Short Term: Participant states understanding of desired cholesterol values and is compliant with medications prescribed. Participant is following exercise prescription and nutrition guidelines.;Long Term: Cholesterol controlled with medications as prescribed, with individualized exercise RX and with personalized nutrition plan. Value goals: LDL < 36mg,49m > 40 mg.       Core Components/Risk Factors/Patient Goals Review:    Core Components/Risk Factors/Patient Goals at Discharge (Final Review):    ITP Comments: ITP Comments    Row Name 06/10/18 1357 06/16/18 0618         ITP Comments  Med Review completed. Initial ITP created. Diagnosis  can be found in [facility] 9/22  30 Day review completed. Continue with ITP unless directed changes per Medical  Director review.         Comments:

## 2018-06-17 ENCOUNTER — Ambulatory Visit: Payer: Self-pay | Admitting: Cardiology

## 2018-06-22 DIAGNOSIS — I214 Non-ST elevation (NSTEMI) myocardial infarction: Secondary | ICD-10-CM | POA: Diagnosis not present

## 2018-06-22 DIAGNOSIS — Z955 Presence of coronary angioplasty implant and graft: Secondary | ICD-10-CM

## 2018-06-22 NOTE — Progress Notes (Signed)
Daily Session Note  Patient Details  Name: Anthony Shepherd MRN: [id number] Date of Birth: 1945-11-25 Referring Provider:     Cardiac Rehab from 06/10/2018 in [facility] Cardiac and Pulmonary Rehab  Referring Provider  Skains      Encounter Date: 06/22/2018  Check In: Session Check In - 06/22/18 0948      Check-In   Supervising physician immediately available to respond to emergencies  See telemetry face sheet for immediately available ER MD    Location  [facility]-Cardiac & Pulmonary Rehab    Staff Present  Nyoka Cowden, RN, BSN, Kela Millin, BA, ACSM CEP, Exercise Physiologist;Krista Frederico Hamman, RN BSN    Medication changes reported      No    Fall or balance concerns reported     No    Warm-up and Cool-down  Performed on first and last piece of equipment    Resistance Training Performed  Yes    VAD Patient?  No    PAD/SET Patient?  No      Pain Assessment   Currently in Pain?  No/denies    Multiple Pain Sites  No          Social History   Tobacco Use  Smoking Status Never Smoker  Smokeless Tobacco Never Used    Goals Met:  Independence with exercise equipment Exercise tolerated well No report of cardiac concerns or symptoms Strength training completed today  Goals Unmet:  Not Applicable  Comments: First full day of exercise!  Patient was oriented to gym and equipment including functions, settings, policies, and procedures.  Patient's individual exercise prescription and treatment plan were reviewed.  All starting workloads were established based on the results of the 6 minute walk test done at initial orientation visit.  The plan for exercise progression was also introduced and progression will be customized based on patient's performance and goals.    Dr. Emily Filbert is Medical Director for [place] and [facility].

## 2018-06-24 ENCOUNTER — Encounter: Payer: Medicare Other | Admitting: *Deleted

## 2018-06-24 DIAGNOSIS — I214 Non-ST elevation (NSTEMI) myocardial infarction: Secondary | ICD-10-CM

## 2018-06-24 DIAGNOSIS — Z955 Presence of coronary angioplasty implant and graft: Secondary | ICD-10-CM

## 2018-06-24 NOTE — Progress Notes (Signed)
Daily Session Note  Patient Details  Name: Anthony Shepherd MRN: [id number] Date of Birth: Jun 02, 1946 Referring Provider:     Cardiac Rehab from 06/10/2018 in [facility] Cardiac and Pulmonary Rehab  Referring Provider  Skains      Encounter Date: 06/24/2018  Check In: Session Check In - 06/24/18 0842      Check-In   Supervising physician immediately available to respond to emergencies  See telemetry face sheet for immediately available ER MD    Location  [facility]-Cardiac & Pulmonary Rehab    Staff Present  Gerlene Burdock, RN, BSN;Analissa Bayless Luan Pulling, MA, RCEP, CCRP, Exercise Physiologist;Amanda Oletta Darter, [place], ACSM CEP, Exercise Physiologist    Medication changes reported      No    Fall or balance concerns reported     No    Warm-up and Cool-down  Performed on first and last piece of equipment    Resistance Training Performed  Yes    VAD Patient?  No    PAD/SET Patient?  No      Pain Assessment   Currently in Pain?  No/denies          Social History   Tobacco Use  Smoking Status Never Smoker  Smokeless Tobacco Never Used    Goals Met:  Independence with exercise equipment Exercise tolerated well No report of cardiac concerns or symptoms Strength training completed today  Goals Unmet:  Not Applicable  Comments: Pt able to follow exercise prescription today without complaint.  Will continue to monitor for progression.    Dr. Emily Filbert is Medical Director for [place] and [facility].

## 2018-06-29 DIAGNOSIS — I214 Non-ST elevation (NSTEMI) myocardial infarction: Secondary | ICD-10-CM

## 2018-06-29 DIAGNOSIS — Z955 Presence of coronary angioplasty implant and graft: Secondary | ICD-10-CM

## 2018-06-29 NOTE — Progress Notes (Signed)
Daily Session Note  Patient Details  Name: Anthony Shepherd MRN: [id number] Date of Birth: 13-Dec-1945 Referring Provider:     Cardiac Rehab from 06/10/2018 in [facility] Cardiac and Pulmonary Rehab  Referring Provider  Skains      Encounter Date: 06/29/2018  Check In: Session Check In - 06/29/18 0907      Check-In   Supervising physician immediately available to respond to emergencies  See telemetry face sheet for immediately available ER MD    Location  [facility]-Cardiac & Pulmonary Rehab    Staff Present  Heath Lark, RN, BSN, CCRP;Amanda Sommer, BA, ACSM CEP, Exercise Physiologist;Laureen Owens Shark, BS, RRT, Respiratory Lennie Hummer, MA, RCEP, CCRP, Exercise Physiologist    Medication changes reported      No    Fall or balance concerns reported     No    Warm-up and Cool-down  Performed on first and last piece of equipment    Resistance Training Performed  Yes    VAD Patient?  No    PAD/SET Patient?  No      Pain Assessment   Currently in Pain?  No/denies          Social History   Tobacco Use  Smoking Status Never Smoker  Smokeless Tobacco Never Used    Goals Met:  Independence with exercise equipment Exercise tolerated well No report of cardiac concerns or symptoms Strength training completed today  Goals Unmet:  Not Applicable  Comments: Pt able to follow exercise prescription today without complaint.  Will continue to monitor for progression.   Dr. Emily Filbert is Medical Director for [place] and [facility].

## 2018-07-01 DIAGNOSIS — Z955 Presence of coronary angioplasty implant and graft: Secondary | ICD-10-CM

## 2018-07-01 DIAGNOSIS — I214 Non-ST elevation (NSTEMI) myocardial infarction: Secondary | ICD-10-CM

## 2018-07-01 NOTE — Progress Notes (Signed)
Daily Session Note  Patient Details  Name: Anthony Shepherd MRN: [id number] Date of Birth: 10-Jan-1946 Referring Provider:     Cardiac Rehab from 06/10/2018 in [facility] Cardiac and Pulmonary Rehab  Referring Provider  Skains      Encounter Date: 07/01/2018  Check In: Session Check In - 07/01/18 0820      Check-In   Supervising physician immediately available to respond to emergencies  See telemetry face sheet for immediately available ER MD    Location  [facility]-Cardiac & Pulmonary Rehab    Staff Present  Carson Myrtle, BS, RRT, Respiratory Therapist;Carroll Enterkin, RN, BSN;Jessica Luan Pulling, MA, RCEP, CCRP, Exercise Physiologist    Medication changes reported      No    Fall or balance concerns reported     No    Warm-up and Cool-down  Performed on first and last piece of equipment    Resistance Training Performed  Yes    VAD Patient?  No    PAD/SET Patient?  No      Pain Assessment   Currently in Pain?  No/denies    Multiple Pain Sites  No          Social History   Tobacco Use  Smoking Status Never Smoker  Smokeless Tobacco Never Used    Goals Met:  Independence with exercise equipment Exercise tolerated well No report of cardiac concerns or symptoms Strength training completed today  Goals Unmet:  Not Applicable  Comments: Pt able to follow exercise prescription today without complaint.  Will continue to monitor for progression. Reviewed home exercise with pt today.  Pt plans to use recumbent bike, walk outisde, and on the treadmill for exercise.  Reviewed THR, pulse, RPE, sign and symptoms, NTG use, and when to call 911 or MD.  Also discussed weather considerations and indoor options.  Pt voiced understanding.  Dr. Emily Filbert is Medical Director for [place] and [facility].

## 2018-07-06 ENCOUNTER — Encounter: Payer: Medicare Other | Admitting: *Deleted

## 2018-07-06 DIAGNOSIS — I214 Non-ST elevation (NSTEMI) myocardial infarction: Secondary | ICD-10-CM | POA: Diagnosis not present

## 2018-07-06 DIAGNOSIS — Z955 Presence of coronary angioplasty implant and graft: Secondary | ICD-10-CM

## 2018-07-06 NOTE — Progress Notes (Signed)
Daily Session Note  Patient Details  Name: Anthony Shepherd MRN: [id number] Date of Birth: 09/22/1945 Referring Provider:     Cardiac Rehab from 06/10/2018 in [facility] Cardiac and Pulmonary Rehab  Referring Provider  Skains      Encounter Date: 07/06/2018  Check In: Session Check In - 07/06/18 0811      Check-In   Supervising physician immediately available to respond to emergencies  See telemetry face sheet for immediately available ER MD    Location  [facility]-Cardiac & Pulmonary Rehab    Staff Present  Alberteen Sam, MA, RCEP, CCRP, Exercise Physiologist;Krista Frederico Hamman, RN BSN    Medication changes reported      No    Fall or balance concerns reported     No    Warm-up and Cool-down  Performed on first and last piece of equipment    Resistance Training Performed  Yes    VAD Patient?  No    PAD/SET Patient?  No      Pain Assessment   Currently in Pain?  No/denies          Social History   Tobacco Use  Smoking Status Never Smoker  Smokeless Tobacco Never Used    Goals Met:  Independence with exercise equipment Exercise tolerated well No report of cardiac concerns or symptoms Strength training completed today  Goals Unmet:  Not Applicable  Comments: Pt able to follow exercise prescription today without complaint.  Will continue to monitor for progression.    Dr. Emily Filbert is Medical Director for [place] and [facility].

## 2018-07-08 ENCOUNTER — Encounter: Payer: Medicare Other | Admitting: *Deleted

## 2018-07-08 DIAGNOSIS — I214 Non-ST elevation (NSTEMI) myocardial infarction: Secondary | ICD-10-CM

## 2018-07-08 DIAGNOSIS — Z955 Presence of coronary angioplasty implant and graft: Secondary | ICD-10-CM

## 2018-07-08 NOTE — Progress Notes (Signed)
Daily Session Note  Patient Details  Name: MONISH HALIBURTON MRN: [id number] Date of Birth: 1945-12-17 Referring Provider:     Cardiac Rehab from 06/10/2018 in [facility] Cardiac and Pulmonary Rehab  Referring Provider  Skains      Encounter Date: 07/08/2018  Check In: Session Check In - 07/08/18 0742      Check-In   Supervising physician immediately available to respond to emergencies  See telemetry face sheet for immediately available ER MD    Location  [facility]-Cardiac & Pulmonary Rehab    Staff Present  Alberteen Sam, MA, RCEP, CCRP, Exercise Physiologist;Amanda Oletta Darter, BA, ACSM CEP, Exercise Physiologist;Mary Kellie Shropshire, RN, BSN, MA    Medication changes reported      No    Fall or balance concerns reported     No    Warm-up and Cool-down  Performed on first and last piece of equipment    Resistance Training Performed  Yes    VAD Patient?  No    PAD/SET Patient?  No      Pain Assessment   Currently in Pain?  No/denies          Social History   Tobacco Use  Smoking Status Never Smoker  Smokeless Tobacco Never Used    Goals Met:  Independence with exercise equipment Exercise tolerated well No report of cardiac concerns or symptoms Strength training completed today  Goals Unmet:  Not Applicable  Comments: Pt able to follow exercise prescription today without complaint.  Will continue to monitor for progression.    Dr. Emily Filbert is Medical Director for [place] and [facility].

## 2018-07-12 ENCOUNTER — Ambulatory Visit: Payer: Medicare Other | Admitting: Nurse Practitioner

## 2018-07-12 ENCOUNTER — Encounter: Payer: Self-pay | Admitting: Nurse Practitioner

## 2018-07-12 VITALS — BP 116/80 | HR 65 | Ht 71.0 in | Wt 189.0 lb

## 2018-07-12 DIAGNOSIS — R079 Chest pain, unspecified: Secondary | ICD-10-CM

## 2018-07-12 DIAGNOSIS — I255 Ischemic cardiomyopathy: Secondary | ICD-10-CM

## 2018-07-12 DIAGNOSIS — I2510 Atherosclerotic heart disease of native coronary artery without angina pectoris: Secondary | ICD-10-CM

## 2018-07-12 DIAGNOSIS — E7849 Other hyperlipidemia: Secondary | ICD-10-CM | POA: Diagnosis not present

## 2018-07-12 DIAGNOSIS — Z7901 Long term (current) use of anticoagulants: Secondary | ICD-10-CM

## 2018-07-12 DIAGNOSIS — I2583 Coronary atherosclerosis due to lipid rich plaque: Secondary | ICD-10-CM

## 2018-07-12 DIAGNOSIS — I5022 Chronic systolic (congestive) heart failure: Secondary | ICD-10-CM

## 2018-07-12 DIAGNOSIS — Z79899 Other long term (current) drug therapy: Secondary | ICD-10-CM

## 2018-07-12 MED ORDER — LOSARTAN POTASSIUM 25 MG PO TABS: 25.0000 mg | ORAL_TABLET | Freq: Every day | ORAL | 3 refills | Status: DC

## 2018-07-12 NOTE — Patient Instructions (Addendum)
We will be checking the following labs today - NONE  Fasting lab on return.     Medication Instructions:    Continue with your current medicines. BUT  I am stopping Lisinopril  I am starting Losartan 25 mg a day - this is at the drug store  STOP aspirin for now. We hope to resume this at a later date   If you need a refill on your cardiac medications before your next appointment, please call your pharmacy.     Testing/Procedures To Be Arranged:  N/A  Follow-Up:   See me in about 2 to 3 weeks with fasting labs  See Dr. Anne Fu a few days after echocardiogram that is already ordered.     At [facility], you and your health needs are our priority.  As part of our continuing mission to provide you with exceptional heart care, we have created designated Provider Care Teams.  These Care Teams include your primary Cardiologist (physician) and Advanced Practice Providers (APPs -  Physician Assistants and Nurse Practitioners) who all work together to provide you with the care you need, when you need it.  Special Instructions:  . None  Call the [facility] office at [phone] if you have any questions, problems or concerns.

## 2018-07-12 NOTE — Progress Notes (Signed)
CARDIOLOGY OFFICE NOTE  Date:  07/12/2018    [name] Date of Birth: 1945-10-10 Medical Record #[id number]  PCP:  Kirby Funk, MD  Cardiologist:  [facility]    Chief Complaint  Patient presents with  . Coronary Artery Disease  . Congestive Heart Failure  . Cardiomyopathy    Follow up visit - seen for Dr. Anne Fu    History of Present Illness: Anthony Shepherd is a 72 y.o. male who presents today for an approximate one month check. Seen for Dr. Anne Fu.   He has a history of CAD with recent admission in September with NSTEMI with PCI to the mid LAD - had LV thrombus noted. EF of 35 to 40%. He was transitioned to Plavix along with anticoagulation with Eliquis. Aspirin to be stopped after one month therapy.   Cath EF was originally reported normal on hand-injection, however echocardiogram showed EF of 35 to 40% with mild MR and TR.  Beta blocker was started.  No ACE inhibitor because of blood pressure at that time.Other issues include glaucoma, HTN and HLD.   Seen about a month ago by Dr. Anne Fu - doing well. Fish oil stopped due to triple therapy anticoagulation. Some subtle discomfort reported.   Comes in today. Here with his wife. He has several concerns today. He has developed a dry hacky cough - wondering if it is related to the ACE. He did not have this before. He has had one spell of chest pain - took NTG - this occurred in the setting of an argument with his wife - has not recurred and was different from his original chest pain syndrome. He is enjoying rehab - going twice a week - has no issues. He is walking/biking the other days that he does not go to rehab. BP is ok - does not check at home. Asking about Co-enzyme Q10. His lipids prior to his event were super low. FH not + for CAD. He has not smoked. He did pick up an eye infection - on some antibiotics for this. No excessive bleeding/bruising. He continues on aspirin - along with his Eliquis and Plavix - he was  not aware about stopping aspirin after the initial 30 days.   Past Surgical History:  Procedure Laterality Date  . CORONARY STENT INTERVENTION N/A 05/31/2018   Procedure: CORONARY STENT INTERVENTION;  Surgeon: Lyn Records, MD;  Location: [facility] INVASIVE CV LAB;  Service: Cardiovascular;  Laterality: N/A;  . LEFT HEART CATH AND CORONARY ANGIOGRAPHY N/A 05/31/2018   Procedure: LEFT HEART CATH AND CORONARY ANGIOGRAPHY;  Surgeon: Lyn Records, MD;  Location: [facility] INVASIVE CV LAB;  Service: Cardiovascular;  Laterality: N/A;  . ULTRASOUND GUIDANCE FOR VASCULAR ACCESS  05/31/2018   Procedure: Ultrasound Guidance For Vascular Access;  Surgeon: Lyn Records, MD;  Location: [facility] INVASIVE CV LAB;  Service: Cardiovascular;;     Medications: Current Meds  Medication Sig  . apixaban (ELIQUIS) 5 MG TABS tablet Take 1 tablet (5 mg total) by mouth 2 (two) times daily.  Marland Kitchen atorvastatin (LIPITOR) 80 MG tablet Take 1 tablet (80 mg total) by mouth daily at 6 PM.  . bimatoprost (LUMIGAN) 0.01 % SOLN Place 1 drop into both eyes at bedtime.  . clopidogrel (PLAVIX) 75 MG tablet Take 1 tablet (75 mg total) by mouth daily.  . Cyanocobalamin (VITAMIN B-12 PO) Take 1 tablet by mouth daily.  Marland Kitchen gentamicin (GARAMYCIN) 0.3 % ophthalmic solution   . hyoscyamine (LEVSIN SL)  0.125 MG SL tablet Place 0.125 mg under the tongue daily as needed for cramping.   . metoprolol succinate (TOPROL-XL) 25 MG 24 hr tablet Take 1 tablet (25 mg total) by mouth daily.  . Multiple Vitamin (MULTIVITAMIN WITH MINERALS) TABS tablet Take 1 tablet by mouth daily.  . naphazoline-pheniramine (NAPHCON-A) 0.025-0.3 % ophthalmic solution Place 1 drop into both eyes 5 (five) times daily as needed for eye irritation (dry eyes/seasonal allergies).  . nitroGLYCERIN (NITROSTAT) 0.4 MG SL tablet Place 1 tablet (0.4 mg total) under the tongue every 5 (five) minutes x 3 doses as needed for chest pain.  . vitamin C (ASCORBIC ACID) 500 MG tablet Take 500 mg by mouth  daily.  . vitamin E 400 UNIT capsule Take 400 Units by mouth daily.  . [DISCONTINUED] aspirin EC 81 MG EC tablet Take 1 tablet (81 mg total) by mouth daily.  . [DISCONTINUED] lisinopril (PRINIVIL,ZESTRIL) 2.5 MG tablet Take 1 tablet (2.5 mg total) by mouth daily.     Allergies: No Known Allergies  Social History: The patient  reports that he has never smoked. He has never used smokeless tobacco. He reports that he does not drink alcohol or use drugs.   Family History: The patient's family history includes Colon cancer in his father; Dementia in his mother. His parents died in their upper 6's. There was no heart disease.   Review of Systems: Please see the history of present illness.   Otherwise, the review of systems is positive for none.   All other systems are reviewed and negative.   Physical Exam: VS:  BP 116/80 (BP Location: Left Arm, Patient Position: Sitting, Cuff Size: Normal)   Pulse 65   Ht 5\' 11"  (1.803 m)   Wt 189 lb (85.7 kg)   BMI 26.36 kg/m  .  BMI Body mass index is 26.36 kg/m.  Wt Readings from Last 3 Encounters:  07/12/18 189 lb (85.7 kg)  06/10/18 190 lb 8 oz (86.4 kg)  06/07/18 191 lb 6.4 oz (86.8 kg)    General: Pleasant. Well developed, well nourished and in no acute distress.   HEENT: Normal.  Neck: Supple, no JVD, carotid bruits, or masses noted.  Cardiac: Regular rate and rhythm. No murmurs, rubs, or gallops. No edema.  Respiratory:  Lungs are clear to auscultation bilaterally with normal work of breathing.  GI: Soft and nontender.  MS: No deformity or atrophy. Gait and ROM intact.  Skin: Warm and dry. Color is normal.  Neuro:  Strength and sensation are intact and no gross focal deficits noted.  Psych: Alert, appropriate and with normal affect.   LABORATORY DATA:  EKG:  EKG is ordered today. This demonstrates NSR.   Lab Results  Component Value Date   WBC 11.9 (H) 06/01/2018   HGB 13.2 06/01/2018   HCT 40.1 06/01/2018   PLT 195  06/01/2018   GLUCOSE 100 (H) 06/01/2018   CHOL 129 05/31/2018   TRIG 156 (H) 05/31/2018   HDL 30 (L) 05/31/2018   LDLCALC 68 05/31/2018   NA 140 06/01/2018   K 3.7 06/01/2018   CL 107 06/01/2018   CREATININE 0.91 06/01/2018   BUN 12 06/01/2018   CO2 26 06/01/2018   INR 1.18 05/31/2018     BNP (last 3 results) No results for input(s): BNP in the last 8760 hours.  ProBNP (last 3 results) No results for input(s): PROBNP in the last 8760 hours.   Other Studies Reviewed Today: CORONARY STENT INTERVENTION 05/2018  LEFT  HEART CATH AND CORONARY ANGIOGRAPHY  Conclusion     A stent was successfully placed.    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  Short left main without significant obstruction  Circumflex with luminal irregularities in the proximal vessel but no high-grade obstruction.  Dominant normal right coronary.  Normal left ventricular size.  LVEDP 8 mmHg.  Successful drug-eluting stent implantation mid LAD segment reducing the 95% stenosis to 0% with TIMI grade III flow.  Synergy 16 x 3.5 mm stent postdilated to 3.75 mm at the distal margin was deployed.  RECOMMENDATIONS:   Aspirin and Brilinta for 12 months.  Would be okay to switch to Plavix after 2 to 3 months assuming no complications/side effects related to Brilinta.  Aggressive risk factor modification.  Probable discharge in a.m. if uneventful course  Recommend dual antiplatelet therapy with Aspirin 81mg  daily and Ticagrelor 90mg  twice daily long-term (beyond 12 months) because of ACS presentation.   Echo Study Conclusions 05/2018  - Left ventricle: The cavity size was normal. Systolic function was   moderately reduced. The estimated ejection fraction was in the   range of 35% to 40%. There is akinesis of the midanteroseptal and   inferoseptal myocardium. There is akinesis of the apical   anterior, apical septal and apical myocardium. There was an   increased  relative contribution of atrial contraction to   ventricular filling. Doppler parameters are consistent with   abnormal left ventricular relaxation (grade 1 diastolic   dysfunction). - Aortic valve: Trileaflet; mildly thickened, mildly calcified   leaflets. - Mitral valve: There was mild regurgitation. - Right ventricle: Systolic function was mildly reduced. - Tricuspid valve: There was mild regurgitation.  Recommendations:  Recommend definity contrast study to rule out apical LV thrombus.   Echo Limited Study Conclusions 05/2018  - Left ventricle: Possible early forming thrombus in LV apex seen   best on apical 3 chamber view. Systolic function was moderately   to severely reduced. The estimated ejection fraction was 35%.   There is akinesis of the midanteroseptal and inferoseptal   myocardium. There is akinesis of the apicalanterior and apical   myocardium.    Assessment/Plan:  1. CAD with recent NSTEMI with PCI/DES to mLAD - he is doing well clinically - he had basically single vessel disease - he has had one spell of chest pain - I suspect this is related to the situation that it occurred. He is doing well with cardiac rehab. No changes made today.   2. Ischemic CM - EF of 35 to 40% per echo - started low dose ACE at last visit - now with cough - will stop and change over to Losartan 25 mg a day.   3. High risk medicine -  Stopping aspirin today. To stay on Eliquis/Plavix   4. LV thrombus - to have repeat echo 3 months from original event - noted per Dr. Anne Fu that "If thrombus has resolved, we will stop Eliquis and resume aspirin to reinitiate dual antiplatelet therapy with aspirin and Plavix".  5. HLD - on statin therapy - recheck lab on return.   Current medicines are reviewed with the patient today.  The patient does not have concerns regarding medicines other than what has been noted above.  The following changes have been made:  See above.  Labs/ tests ordered  today include:    Orders Placed This Encounter  Procedures  . EKG 12-Lead     Disposition:   FU  with me in 2 to 3 weeks. Needs follow up with Dr. Anne Fu following echo in December.   Patient is agreeable to this plan and will call if any problems develop in the interim.   SignedNorma Fredrickson, NP  07/12/2018 9:57 AM  [facility] [address] [place], [place]  [address] Phone: [phone] Fax: [phone]

## 2018-07-13 ENCOUNTER — Encounter: Payer: Medicare Other | Attending: Cardiology

## 2018-07-13 DIAGNOSIS — I214 Non-ST elevation (NSTEMI) myocardial infarction: Secondary | ICD-10-CM | POA: Insufficient documentation

## 2018-07-13 DIAGNOSIS — Z955 Presence of coronary angioplasty implant and graft: Secondary | ICD-10-CM | POA: Insufficient documentation

## 2018-07-13 DIAGNOSIS — Z7982 Long term (current) use of aspirin: Secondary | ICD-10-CM | POA: Diagnosis not present

## 2018-07-13 DIAGNOSIS — Z79899 Other long term (current) drug therapy: Secondary | ICD-10-CM | POA: Insufficient documentation

## 2018-07-13 NOTE — Progress Notes (Signed)
Daily Session Note  Patient Details  Name: Anthony Shepherd MRN: [id number] Date of Birth: 1945-10-18 Referring Provider:     Cardiac Rehab from 06/10/2018 in [facility] Cardiac and Pulmonary Rehab  Referring Provider  Skains      Encounter Date: 07/13/2018  Check In: Session Check In - 07/13/18 0906      Check-In   Supervising physician immediately available to respond to emergencies  See telemetry face sheet for immediately available ER MD    Location  [facility]-Cardiac & Pulmonary Rehab    Staff Present  Alberteen Sam, MA, RCEP, CCRP, Exercise Physiologist;Susanne Bice, RN, BSN, CCRP;Other    Medication changes reported      No    Fall or balance concerns reported     No    Warm-up and Cool-down  Performed on first and last piece of equipment    Resistance Training Performed  Yes    VAD Patient?  No    PAD/SET Patient?  No      Pain Assessment   Currently in Pain?  No/denies    Multiple Pain Sites  No          Social History   Tobacco Use  Smoking Status Never Smoker  Smokeless Tobacco Never Used    Goals Met:  Independence with exercise equipment Exercise tolerated well No report of cardiac concerns or symptoms Strength training completed today  Goals Unmet:  Not Applicable  Comments: Pt able to follow exercise prescription today without complaint.  Will continue to monitor for progression.   Dr. Emily Filbert is Medical Director for [place] and [facility].

## 2018-07-14 ENCOUNTER — Encounter: Payer: Self-pay | Admitting: *Deleted

## 2018-07-14 DIAGNOSIS — I214 Non-ST elevation (NSTEMI) myocardial infarction: Secondary | ICD-10-CM

## 2018-07-14 DIAGNOSIS — Z955 Presence of coronary angioplasty implant and graft: Secondary | ICD-10-CM

## 2018-07-14 NOTE — Progress Notes (Signed)
Cardiac Individual Treatment Plan  Patient Details  Name: Anthony Shepherd MRN: [id number] Date of Birth: 08-09-1946 Referring Provider:     Cardiac Rehab from 06/10/2018 in [facility] Cardiac and Pulmonary Rehab  Referring Provider  Skains      Initial Encounter Date:    Cardiac Rehab from 06/10/2018 in [facility] Cardiac and Pulmonary Rehab  Date  06/10/18      Visit Diagnosis: Status post coronary artery stent placement  NSTEMI (non-ST elevated myocardial infarction) [facility])  Patient's Home Medications on Admission:  Current Outpatient Medications:  .  apixaban (ELIQUIS) 5 MG TABS tablet, Take 1 tablet (5 mg total) by mouth 2 (two) times daily., Disp: 60 tablet, Rfl: 3 .  atorvastatin (LIPITOR) 80 MG tablet, Take 1 tablet (80 mg total) by mouth daily at 6 PM., Disp: 90 tablet, Rfl: 3 .  bimatoprost (LUMIGAN) 0.01 % SOLN, Place 1 drop into both eyes at bedtime., Disp: , Rfl:  .  clopidogrel (PLAVIX) 75 MG tablet, Take 1 tablet (75 mg total) by mouth daily., Disp: 30 tablet, Rfl: 6 .  Cyanocobalamin (VITAMIN B-12 PO), Take 1 tablet by mouth daily., Disp: , Rfl:  .  gentamicin (GARAMYCIN) 0.3 % ophthalmic solution, , Disp: , Rfl:  .  hyoscyamine (LEVSIN SL) 0.125 MG SL tablet, Place 0.125 mg under the tongue daily as needed for cramping. , Disp: , Rfl:  .  losartan (COZAAR) 25 MG tablet, Take 1 tablet (25 mg total) by mouth daily., Disp: 90 tablet, Rfl: 3 .  metoprolol succinate (TOPROL-XL) 25 MG 24 hr tablet, Take 1 tablet (25 mg total) by mouth daily., Disp: 30 tablet, Rfl: 3 .  Multiple Vitamin (MULTIVITAMIN WITH MINERALS) TABS tablet, Take 1 tablet by mouth daily., Disp: , Rfl:  .  naphazoline-pheniramine (NAPHCON-A) 0.025-0.3 % ophthalmic solution, Place 1 drop into both eyes 5 (five) times daily as needed for eye irritation (dry eyes/seasonal allergies)., Disp: , Rfl:  .  nitroGLYCERIN (NITROSTAT) 0.4 MG SL tablet, Place 1 tablet (0.4 mg total) under the tongue every 5 (five) minutes x 3 doses  as needed for chest pain., Disp: 25 tablet, Rfl: 3 .  vitamin C (ASCORBIC ACID) 500 MG tablet, Take 500 mg by mouth daily., Disp: , Rfl:  .  vitamin E 400 UNIT capsule, Take 400 Units by mouth daily., Disp: , Rfl:   Past Medical History: No past medical history on file.  Tobacco Use: Social History   Tobacco Use  Smoking Status Never Smoker  Smokeless Tobacco Never Used    Labs: Recent Review Scientist, physiological    Labs for ITP Cardiac and Pulmonary Rehab Latest Ref Rng & Units 05/31/2018   Cholestrol 0 - 200 mg/dL 129   LDLCALC 0 - 99 mg/dL 68   HDL >40 mg/dL 30(L)   Trlycerides <150 mg/dL 156(H)       Exercise Target Goals: Exercise Program Goal: Individual exercise prescription set using results from initial 6 min walk test and THRR while considering  patient's activity barriers and safety.   Exercise Prescription Goal: Initial exercise prescription builds to 30-45 minutes a day of aerobic activity, 2-3 days per week.  Home exercise guidelines will be given to patient during program as part of exercise prescription that the participant will acknowledge.  Activity Barriers & Risk Stratification:   6 Minute Walk: 6 Minute Walk    Row Name 06/10/18 1337         6 Minute Walk   Phase  Initial  Distance  1400 feet     Distance % Change  0 %     Distance Feet Change  0 ft     Walk Time  6 minutes     # of Rest Breaks  0     MPH  2.65     METS  3.25     RPE  14     Perceived Dyspnea   0     VO2 Peak  11.38     Symptoms  No     Resting HR  59 bpm     Resting BP  112/68     Resting Oxygen Saturation   98 %     Max Ex. HR  107 bpm     Max Ex. BP  132/60     2 Minute Post BP  110/64        Oxygen Initial Assessment:   Oxygen Re-Evaluation:   Oxygen Discharge (Final Oxygen Re-Evaluation):   Initial Exercise Prescription: Initial Exercise Prescription - 06/10/18 1300      Date of Initial Exercise RX and Referring Provider   Date  06/10/18    Referring  Provider  Skains      Treadmill   MPH  2.6    Grade  0.5    Minutes  15    METs  3.17      Recumbant Bike   Level  4    RPM  60    Watts  34    Minutes  15    METs  3.25      NuStep   Level  3    SPM  80    Minutes  15    METs  3.25      Prescription Details   Frequency (times per week)  3    Duration  Progress to 30 minutes of continuous aerobic without signs/symptoms of physical distress      Intensity   THRR 40-80% of Max Heartrate  95-131    Ratings of Perceived Exertion  11-15    Perceived Dyspnea  0-4      Progression   Progression  Continue to progress workloads to maintain intensity without signs/symptoms of physical distress.      Resistance Training   Training Prescription  Yes    Weight  3 lb    Reps  10-15       Perform Capillary Blood Glucose checks as needed.  Exercise Prescription Changes: Exercise Prescription Changes    Row Name 06/10/18 1300 07/01/18 0800 07/05/18 1300         Response to Exercise   Blood Pressure (Admit)  112/68  -  132/70     Blood Pressure (Exercise)  132/60  -  136/70     Blood Pressure (Exit)  110/64  -  126/70     Heart Rate (Admit)  59 bpm  -  92 bpm     Heart Rate (Exercise)  107 bpm  -  119 bpm     Heart Rate (Exit)  60 bpm  -  91 bpm     Oxygen Saturation (Admit)  98 %  -  -     Rating of Perceived Exertion (Exercise)  14  -  13     Perceived Dyspnea (Exercise)  0  -  -     Symptoms  none  -  none     Duration  -  -  Continue with 30 min of  aerobic exercise without signs/symptoms of physical distress.     Intensity  -  -  THRR unchanged       Progression   Progression  -  -  Continue to progress workloads to maintain intensity without signs/symptoms of physical distress.     Average METs  -  -  3.53       Resistance Training   Training Prescription  -  -  Yes     Weight  -  -  4 lbs     Reps  -  -  10-15       Interval Training   Interval Training  -  -  No       Treadmill   MPH  -  -  2.5      Grade  -  -  1     Minutes  -  -  15     METs  -  -  3.26       Recumbant Bike   Level  -  -  4     RPM  -  -  50     Minutes  -  -  15       NuStep   Level  -  -  3     Minutes  -  -  15     METs  -  -  3.8       Home Exercise Plan   Plans to continue exercise at  -  Home (comment) Recumbent Bike, walking, treadmill  Home (comment) Recumbent Bike, walking, treadmill     Frequency  -  Add 3 additional days to program exercise sessions.  Add 3 additional days to program exercise sessions.     Initial Home Exercises Provided  -  07/01/18  07/01/18        Exercise Comments: Exercise Comments    Row Name 06/22/18 0949           Exercise Comments  First full day of exercise!  Patient was oriented to gym and equipment including functions, settings, policies, and procedures.  Patient's individual exercise prescription and treatment plan were reviewed.  All starting workloads were established based on the results of the 6 minute walk test done at initial orientation visit.  The plan for exercise progression was also introduced and progression will be customized based on patient's performance and goals.          Exercise Goals and Review: Exercise Goals    Row Name 06/10/18 1336             Exercise Goals   Increase Physical Activity  Yes       Intervention  Provide advice, education, support and counseling about physical activity/exercise needs.;Develop an individualized exercise prescription for aerobic and resistive training based on initial evaluation findings, risk stratification, comorbidities and participant's personal goals.       Expected Outcomes  Short Term: Attend rehab on a regular basis to increase amount of physical activity.;Long Term: Add in home exercise to make exercise part of routine and to increase amount of physical activity.;Long Term: Exercising regularly at least 3-5 days a week.       Increase Strength and Stamina  Yes       Intervention  Provide advice,  education, support and counseling about physical activity/exercise needs.;Develop an individualized exercise prescription for aerobic and resistive training based on initial evaluation findings, risk stratification, comorbidities and participant's personal goals.  Expected Outcomes  Short Term: Increase workloads from initial exercise prescription for resistance, speed, and METs.;Short Term: Perform resistance training exercises routinely during rehab and add in resistance training at home;Long Term: Improve cardiorespiratory fitness, muscular endurance and strength as measured by increased METs and functional capacity (6MWT)       Able to understand and use rate of perceived exertion (RPE) scale  Yes       Intervention  Provide education and explanation on how to use RPE scale       Expected Outcomes  Short Term: Able to use RPE daily in rehab to express subjective intensity level;Long Term:  Able to use RPE to guide intensity level when exercising independently       Able to understand and use Dyspnea scale  Yes       Intervention  Provide education and explanation on how to use Dyspnea scale       Expected Outcomes  Short Term: Able to use Dyspnea scale daily in rehab to express subjective sense of shortness of breath during exertion;Long Term: Able to use Dyspnea scale to guide intensity level when exercising independently       Knowledge and understanding of Target Heart Rate Range (THRR)  Yes       Intervention  Provide education and explanation of THRR including how the numbers were predicted and where they are located for reference       Expected Outcomes  Short Term: Able to state/look up THRR;Short Term: Able to use daily as guideline for intensity in rehab;Long Term: Able to use THRR to govern intensity when exercising independently       Able to check pulse independently  Yes       Intervention  Provide education and demonstration on how to check pulse in carotid and radial  arteries.;Review the importance of being able to check your own pulse for safety during independent exercise       Expected Outcomes  Short Term: Able to explain why pulse checking is important during independent exercise;Long Term: Able to check pulse independently and accurately       Understanding of Exercise Prescription  Yes       Intervention  Provide education, explanation, and written materials on patient's individual exercise prescription       Expected Outcomes  Short Term: Able to explain program exercise prescription;Long Term: Able to explain home exercise prescription to exercise independently          Exercise Goals Re-Evaluation : Exercise Goals Re-Evaluation    Row Name 06/22/18 0949 07/01/18 0829           Exercise Goal Re-Evaluation   Exercise Goals Review  Increase Physical Activity;Increase Strength and Stamina;Able to understand and use rate of perceived exertion (RPE) scale;Knowledge and understanding of Target Heart Rate Range (THRR);Understanding of Exercise Prescription  Increase Physical Activity;Able to understand and use rate of perceived exertion (RPE) scale;Knowledge and understanding of Target Heart Rate Range (THRR);Understanding of Exercise Prescription;Increase Strength and Stamina;Able to check pulse independently      Comments  Reviewed RPE scale, THR and program prescription with pt today.  Pt voiced understanding and was given a copy of goals to take home.   Reviewed home exercise with pt today.  Pt plans to use recumbent bike, walk outisde, and on the treadmill for exercise.  Reviewed THR, pulse, RPE, sign and symptoms, NTG use, and when to call 911 or MD.  Also discussed weather considerations and indoor options.  Pt voiced understanding.      Expected Outcomes  Short: Use RPE daily to regulate intensity. Long: Follow program prescription in THR.  Short: Start home exercise. Long: Continue to exercise independently.         Discharge Exercise  Prescription (Final Exercise Prescription Changes): Exercise Prescription Changes - 07/05/18 1300      Response to Exercise   Blood Pressure (Admit)  132/70    Blood Pressure (Exercise)  136/70    Blood Pressure (Exit)  126/70    Heart Rate (Admit)  92 bpm    Heart Rate (Exercise)  119 bpm    Heart Rate (Exit)  91 bpm    Rating of Perceived Exertion (Exercise)  13    Symptoms  none    Duration  Continue with 30 min of aerobic exercise without signs/symptoms of physical distress.    Intensity  THRR unchanged      Progression   Progression  Continue to progress workloads to maintain intensity without signs/symptoms of physical distress.    Average METs  3.53      Resistance Training   Training Prescription  Yes    Weight  4 lbs    Reps  10-15      Interval Training   Interval Training  No      Treadmill   MPH  2.5    Grade  1    Minutes  15    METs  3.26      Recumbant Bike   Level  4    RPM  50    Minutes  15      NuStep   Level  3    Minutes  15    METs  3.8      Home Exercise Plan   Plans to continue exercise at  Home (comment)   Recumbent Bike, walking, treadmill   Frequency  Add 3 additional days to program exercise sessions.    Initial Home Exercises Provided  07/01/18       Nutrition:  Target Goals: Understanding of nutrition guidelines, daily intake of sodium <159m, cholesterol <204m calories 30% from fat and 7% or less from saturated fats, daily to have 5 or more servings of fruits and vegetables.  Biometrics: Pre Biometrics - 06/10/18 1335      Pre Biometrics   Height  5' 11.5" (1.816 m)    Weight  190 lb 8 oz (86.4 kg)    Waist Circumference  40.5 inches    Hip Circumference  41 inches    Waist to Hip Ratio  0.99 %    BMI (Calculated)  26.2    Single Leg Stand  16.5 seconds        Nutrition Therapy Plan and Nutrition Goals: Nutrition Therapy & Goals - 06/24/18 1155      Nutrition Therapy   Diet  DASH    Drug/Food Interactions   Statins/Certain Fruits    Protein (specify units)  9-10oz    Fiber  35 grams    Whole Grain Foods  3 servings   eats white-wheat bread   Saturated Fats  16 max. grams    Fruits and Vegetables  6 servings/day   8 ideal; eats more vegetables than fruits- eats fruits every few days   Sodium  1500 grams      Personal Nutrition Goals   Nutrition Goal  Do not add salt to meals after it has been cooked. Eventually, work with your wife to use less  salt during the cooking process    Personal Goal #2  When buying canned soups, look for varieties that are lower in sodium. Ideally with 175m or less per serving    Personal Goal #3  Eat at least one additional serving of fruit per week    Comments  He has been trying to limit salt and overall fat intake. Eats 2-3 meals/day depending on his work schedule. Breakfast options include oatmeal with granola, eggs / bacon/ grits once per week. He and his wife limit red meat and fried food intake. He does not add salt at the table but his wife still cooks with salt.      Intervention Plan   Intervention  Prescribe, educate and counsel regarding individualized specific dietary modifications aiming towards targeted core components such as weight, hypertension, lipid management, diabetes, heart failure and other comorbidities.    Expected Outcomes  Short Term Goal: Understand basic principles of dietary content, such as calories, fat, sodium, cholesterol and nutrients.;Long Term Goal: Adherence to prescribed nutrition plan.;Short Term Goal: A plan has been developed with personal nutrition goals set during dietitian appointment.       Nutrition Assessments: Nutrition Assessments - 06/10/18 1411      MEDFICTS Scores   Pre Score  49       Nutrition Goals Re-Evaluation: Nutrition Goals Re-Evaluation    Row Name 06/24/18 1232             Goals   Nutrition Goal  When buying canned soups, look for varieties that are lower in sodium. Ideally with 1456mor  less per serving       Comment  He has been buying canned soups but has not checked the sodium content on the label       Expected Outcome  He will choose canned items/soups with 14060mf sodium or less per serving         Personal Goal #2 Re-Evaluation   Personal Goal #2  Do not add salt to meals after it has been cooked. Eventually work with your wife to use less salt during the cooking process         Personal Goal #3 Re-Evaluation   Personal Goal #3  Eat at least one additional serving of fruit per week          Nutrition Goals Discharge (Final Nutrition Goals Re-Evaluation): Nutrition Goals Re-Evaluation - 06/24/18 1232      Goals   Nutrition Goal  When buying canned soups, look for varieties that are lower in sodium. Ideally with 140m29m less per serving    Comment  He has been buying canned soups but has not checked the sodium content on the label    Expected Outcome  He will choose canned items/soups with 140mg14msodium or less per serving      Personal Goal #2 Re-Evaluation   Personal Goal #2  Do not add salt to meals after it has been cooked. Eventually work with your wife to use less salt during the cooking process      Personal Goal #3 Re-Evaluation   Personal Goal #3  Eat at least one additional serving of fruit per week       Psychosocial: Target Goals: Acknowledge presence or absence of significant depression and/or stress, maximize coping skills, provide positive support system. Participant is able to verbalize types and ability to use techniques and skills needed for reducing stress and depression.   Initial Review & Psychosocial Screening: Initial  Psych Review & Screening - 06/10/18 1406      Initial Review   Current issues with  None Identified   didn't report any issues during Med Review. Lives a socially active lifestyle and is happy to work on his health to become independent with exercise and heart healthy eating     [place]?  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      Screening Interventions   Interventions  Encouraged to exercise;Program counselor consult;To provide support and resources with identified psychosocial needs;Provide feedback about the scores to participant    Expected Outcomes  Short Term goal: Utilizing psychosocial counselor, staff and physician to assist with identification of specific Stressors or current issues interfering with healing process. Setting desired goal for each stressor or current issue identified.;Long Term Goal: Stressors or current issues are controlled or eliminated.;Short Term goal: Identification and review with participant of any Quality of Life or Depression concerns found by scoring the questionnaire.;Long Term goal: The participant improves quality of Life and PHQ9 Scores as seen by post scores and/or verbalization of changes       Quality of Life Scores:  Quality of Life - 06/10/18 1407      Quality of Life   Select  Quality of Life      Quality of Life Scores   Health/Function Pre  29.6 %    Socioeconomic Pre  30 %    Psych/Spiritual Pre  30 %    Family Pre  30 %    GLOBAL Pre  29.82 %      Scores of 19 and below usually indicate a poorer quality of life in these areas.  A difference of  2-3 points is a clinically meaningful difference.  A difference of 2-3 points in the total score of the Quality of Life Index has been associated with significant improvement in overall quality of life, self-image, physical symptoms, and general health in studies assessing change in quality of life.  PHQ-9: Recent Review Flowsheet Data    Depression screen [facility] 2/9 06/10/2018   Decreased Interest 0   Down, Depressed, Hopeless 0   PHQ - 2 Score 0   Altered sleeping 0   Tired, decreased  energy 0   Change in appetite 0   Feeling bad or failure about yourself  0   Trouble concentrating 0   Moving slowly or fidgety/restless 0   Suicidal thoughts 0   PHQ-9 Score 0     Interpretation of Total Score  Total Score Depression Severity:  1-4 = Minimal depression, 5-9 = Mild depression, 10-14 = Moderate depression, 15-19 = Moderately severe depression, 20-27 = Severe depression   Psychosocial Evaluation and Intervention: Psychosocial Evaluation - 07/06/18 1011      Psychosocial Evaluation & Interventions   Interventions  Encouraged to exercise with the program and follow exercise prescription    Comments  Counselor met with Mr. Stuard Ronalee Belts) today for initial psychosocial evaluation.  He is a 72 year old who had a heart attack recently and a stent inserted.  He reports being in good health other than his cardiac condition and being diagnosed with glaucoma approx. 6 months ago and is being treated with medications which are being effective.  He has a strong support system with a  spouse of 58 years; a daughter and sister-in law locally and active involvement in his local church.  Ronalee Belts reports sleeping well and having a good appetite.  He denies a history of depression or anxiety and is typically in a positive mood.  Ronalee Belts has minimal stress in his life at this time; although he works part-time at [employer].  Ronalee Belts has goals to "get healthy" and develop an exercise routine.  He will be followed by staff.    Expected Outcomes  Short:  Ronalee Belts will exercise consistently to improve his health overall.   Long:  Ronalee Belts will develop an exercise routine and maintain this for his health and mental health.    Continue Psychosocial Services   Follow up required by staff       Psychosocial Re-Evaluation:   Psychosocial Discharge (Final Psychosocial Re-Evaluation):   Vocational Rehabilitation: Provide vocational rehab assistance to qualifying candidates.   Vocational Rehab Evaluation &  Intervention: Vocational Rehab - 06/10/18 1405      Initial Vocational Rehab Evaluation & Intervention   Assessment shows need for Vocational Rehabilitation  No       Education: Education Goals: Education classes will be provided on a variety of topics geared toward better understanding of heart health and risk factor modification. Participant will state understanding/return demonstration of topics presented as noted by education test scores.  Learning Barriers/Preferences: Learning Barriers/Preferences - 06/10/18 1404      Learning Barriers/Preferences   Learning Barriers  None    Learning Preferences  None       Education Topics:  AED/CPR: - Group verbal and written instruction with the use of models to demonstrate the basic use of the AED with the basic ABC's of resuscitation.   Cardiac Rehab from 07/13/2018 in [facility] Cardiac and Pulmonary Rehab  Date  07/08/18  Educator  MA  Instruction Review Code  1- Verbalizes Understanding      General Nutrition Guidelines/Fats and Fiber: -Group instruction provided by verbal, written material, models and posters to present the general guidelines for heart healthy nutrition. Gives an explanation and review of dietary fats and fiber.   Cardiac Rehab from 07/13/2018 in [facility] Cardiac and Pulmonary Rehab  Date  07/13/18  Educator  LB  Instruction Review Code  1- Verbalizes Understanding      Controlling Sodium/Reading Food Labels: -Group verbal and written material supporting the discussion of sodium use in heart healthy nutrition. Review and explanation with models, verbal and written materials for utilization of the food label.   Exercise Physiology & General Exercise Guidelines: - Group verbal and written instruction with models to review the exercise physiology of the cardiovascular system and associated critical values. Provides general exercise guidelines with specific guidelines to those with heart or lung disease.    Aerobic  Exercise & Resistance Training: - Gives group verbal and written instruction on the various components of exercise. Focuses on aerobic and resistive training programs and the benefits of this training and how to safely progress through these programs..   Flexibility, Balance, Mind/Body Relaxation: Provides group verbal/written instruction on the benefits of flexibility and balance training, including mind/body exercise modes such as yoga, pilates and tai chi.  Demonstration and skill practice provided.   Stress and Anxiety: - Provides group verbal and written instruction about the health risks of elevated stress and causes of high stress.  Discuss the correlation between heart/lung disease and anxiety and treatment options. Review healthy ways to manage with stress and anxiety.   Cardiac  Rehab from 07/13/2018 in [facility] Cardiac and Pulmonary Rehab  Date  06/22/18  Educator  [place] [facility]  Instruction Review Code  1- Verbalizes Understanding      Depression: - Provides group verbal and written instruction on the correlation between heart/lung disease and depressed mood, treatment options, and the stigmas associated with seeking treatment.   Anatomy & Physiology of the Heart: - Group verbal and written instruction and models provide basic cardiac anatomy and physiology, with the coronary electrical and arterial systems. Review of Valvular disease and Heart Failure   Cardiac Rehab from 07/13/2018 in [facility] Cardiac and Pulmonary Rehab  Date  06/24/18  Educator  CE  Instruction Review Code  1- Verbalizes Understanding      Cardiac Procedures: - Group verbal and written instruction to review commonly prescribed medications for heart disease. Reviews the medication, class of the drug, and side effects. Includes the steps to properly store meds and maintain the prescription regimen. (beta blockers and nitrates)   Cardiac Rehab from 07/13/2018 in [facility] Cardiac and Pulmonary Rehab  Date  07/06/18  Educator  [place]   Instruction Review Code  1- Verbalizes Understanding      Cardiac Medications I: - Group verbal and written instruction to review commonly prescribed medications for heart disease. Reviews the medication, class of the drug, and side effects. Includes the steps to properly store meds and maintain the prescription regimen.   Cardiac Rehab from 07/13/2018 in [facility] Cardiac and Pulmonary Rehab  Date  06/29/18  Educator  SB  Instruction Review Code  1- Verbalizes Understanding      Cardiac Medications II: -Group verbal and written instruction to review commonly prescribed medications for heart disease. Reviews the medication, class of the drug, and side effects. (all other drug classes)    Go Sex-Intimacy & Heart Disease, Get SMART - Goal Setting: - Group verbal and written instruction through game format to discuss heart disease and the return to sexual intimacy. Provides group verbal and written material to discuss and apply goal setting through the application of the S.M.A.R.T. Method.   Cardiac Rehab from 07/13/2018 in [facility] Cardiac and Pulmonary Rehab  Date  07/06/18  Educator  [place]  Instruction Review Code  1- Verbalizes Understanding      Other Matters of the Heart: - Provides group verbal, written materials and models to describe Stable Angina and Peripheral Artery. Includes description of the disease process and treatment options available to the cardiac patient.   Cardiac Rehab from 07/13/2018 in [facility] Cardiac and Pulmonary Rehab  Date  06/24/18  Educator  CE  Instruction Review Code  1- Verbalizes Understanding      Exercise & Equipment Safety: - Individual verbal instruction and demonstration of equipment use and safety with use of the equipment.   Cardiac Rehab from 07/13/2018 in [facility] Cardiac and Pulmonary Rehab  Date  06/10/18  Educator  [facility]  Instruction Review Code  1- Verbalizes Understanding      Infection Prevention: - Provides verbal and written material to  individual with discussion of infection control including proper hand washing and proper equipment cleaning during exercise session.   Cardiac Rehab from 07/13/2018 in [facility] Cardiac and Pulmonary Rehab  Date  06/10/18  Educator  [facility]  Instruction Review Code  1- Verbalizes Understanding      Falls Prevention: - Provides verbal and written material to individual with discussion of falls prevention and safety.   Cardiac Rehab from 07/13/2018 in [facility] Cardiac and Pulmonary Rehab  Date  06/10/18  Educator  [facility]  Instruction Review Code  1- Verbalizes Understanding      Diabetes: - Individual verbal and written instruction to review signs/symptoms of diabetes, desired ranges of glucose level fasting, after meals and with exercise. Acknowledge that pre and post exercise glucose checks will be done for 3 sessions at entry of program.   Know Your Numbers and Risk Factors: -Group verbal and written instruction about important numbers in your health.  Discussion of what are risk factors and how they play a role in the disease process.  Review of Cholesterol, Blood Pressure, Diabetes, and BMI and the role they play in your overall health.   Sleep Hygiene: -Provides group verbal and written instruction about how sleep can affect your health.  Define sleep hygiene, discuss sleep cycles and impact of sleep habits. Review good sleep hygiene tips.    Other: -Provides group and verbal instruction on various topics (see comments)   Knowledge Questionnaire Score: Knowledge Questionnaire Score - 06/10/18 1404      Knowledge Questionnaire Score   Pre Score  23/26   correct answers reviewed with with Ronalee Belts. focus on exercise and Angina      Core Components/Risk Factors/Patient Goals at Admission: Personal Goals and Risk Factors at Admission - 06/10/18 1402      Core Components/Risk Factors/Patient Goals on Admission    Weight Management  Yes;Weight Loss    Intervention  Weight Management: Develop a  combined nutrition and exercise program designed to reach desired caloric intake, while maintaining appropriate intake of nutrient and fiber, sodium and fats, and appropriate energy expenditure required for the weight goal.;Weight Management: Provide education and appropriate resources to help participant work on and attain dietary goals.;Weight Management/Obesity: Establish reasonable short term and long term weight goals.    Admit Weight  190 lb 8 oz (86.4 kg)    Goal Weight: Short Term  185 lb (83.9 kg)    Goal Weight: Long Term  180 lb (81.6 kg)    Expected Outcomes  Short Term: Continue to assess and modify interventions until short term weight is achieved;Long Term: Adherence to nutrition and physical activity/exercise program aimed toward attainment of established weight goal;Weight Loss: Understanding of general recommendations for a balanced deficit meal plan, which promotes 1-2 lb weight loss per week and includes a negative energy balance of [phone] kcal/d;Understanding recommendations for meals to include 15-35% energy as protein, 25-35% energy from fat, 35-60% energy from carbohydrates, less than 289m of dietary cholesterol, 20-35 gm of total fiber daily;Understanding of distribution of calorie intake throughout the day with the consumption of 4-5 meals/snacks    Hypertension  Yes    Intervention  Provide education on lifestyle modifcations including regular physical activity/exercise, weight management, moderate sodium restriction and increased consumption of fresh fruit, vegetables, and low fat dairy, alcohol moderation, and smoking cessation.;Monitor prescription use compliance.    Expected Outcomes  Short Term: Continued assessment and intervention until BP is < 140/974mHG in hypertensive participants. < 130/8044mG in hypertensive participants with diabetes, heart failure or chronic kidney disease.;Long Term: Maintenance of blood pressure at goal levels.    Lipids  Yes    Intervention   Provide education and support for participant on nutrition & aerobic/resistive exercise along with prescribed medications to achieve LDL <51m69mDL >40mg76m Expected Outcomes  Short Term: Participant states understanding of desired cholesterol values and is compliant with medications prescribed. Participant is following exercise prescription and nutrition guidelines.;Long Term: Cholesterol controlled with medications as  prescribed, with individualized exercise RX and with personalized nutrition plan. Value goals: LDL < 39m, HDL > 40 mg.       Core Components/Risk Factors/Patient Goals Review:    Core Components/Risk Factors/Patient Goals at Discharge (Final Review):    ITP Comments: ITP Comments    Row Name 06/10/18 1357 06/16/18 0618 06/16/18 0626 07/14/18 0551     ITP Comments  Med Review completed. Initial ITP created. Diagnosis [facility] be found [facility]  30 Day review completed. Continue with ITP unless directed changes per Medical Director review.  30 Day review completed. Continue with ITP unless directed changes per Medical Director review.  30 day review. Continue with ITP unless direccted changes per Medical Director Chart Review.       Comments:

## 2018-07-15 ENCOUNTER — Encounter: Payer: Medicare Other | Admitting: *Deleted

## 2018-07-15 DIAGNOSIS — I214 Non-ST elevation (NSTEMI) myocardial infarction: Secondary | ICD-10-CM

## 2018-07-15 DIAGNOSIS — Z955 Presence of coronary angioplasty implant and graft: Secondary | ICD-10-CM

## 2018-07-15 NOTE — Progress Notes (Signed)
Daily Session Note  Patient Details  Name: Anthony Shepherd MRN: [id number] Date of Birth: 1946/05/23 Referring Provider:     Cardiac Rehab from 06/10/2018 in [facility] Cardiac and Pulmonary Rehab  Referring Provider  Skains      Encounter Date: 07/15/2018  Check In: Session Check In - 07/15/18 0746      Check-In   Supervising physician immediately available to respond to emergencies  See telemetry face sheet for immediately available ER MD    Location  [facility]-Cardiac & Pulmonary Rehab    Staff Present  (S) Gerlene Burdock, RN, BSN;Other;Ark Agrusa Luan Pulling, MA, RCEP, CCRP, Exercise Physiologist;Amanda Oletta Darter, BA, ACSM CEP, Exercise Physiologist   [name]   Medication changes reported      No    Fall or balance concerns reported     No    Warm-up and Cool-down  Performed on first and last piece of equipment    Resistance Training Performed  Yes    VAD Patient?  No    PAD/SET Patient?  No      Pain Assessment   Currently in Pain?  Yes          Social History   Tobacco Use  Smoking Status Never Smoker  Smokeless Tobacco Never Used    Goals Met:  Independence with exercise equipment Exercise tolerated well No report of cardiac concerns or symptoms Strength training completed today  Goals Unmet:  Not Applicable  Comments: Pt able to follow exercise prescription today without complaint.  Will continue to monitor for progression.    Dr. Emily Filbert is Medical Director for [place] and [facility].

## 2018-07-20 ENCOUNTER — Encounter: Payer: Medicare Other | Admitting: *Deleted

## 2018-07-20 DIAGNOSIS — I214 Non-ST elevation (NSTEMI) myocardial infarction: Secondary | ICD-10-CM | POA: Diagnosis not present

## 2018-07-20 DIAGNOSIS — Z955 Presence of coronary angioplasty implant and graft: Secondary | ICD-10-CM

## 2018-07-20 NOTE — Progress Notes (Signed)
Daily Session Note  Patient Details  Name: Anthony Shepherd MRN: [id number] Date of Birth: December 22, 1945 Referring Provider:     Cardiac Rehab from 06/10/2018 in [facility] Cardiac and Pulmonary Rehab  Referring Provider  Skains      Encounter Date: 07/20/2018  Check In: Session Check In - 07/20/18 0756      Check-In   Supervising physician immediately available to respond to emergencies  See telemetry face sheet for immediately available ER MD    Location  [facility]-Cardiac & Pulmonary Rehab    Staff Present  Heath Lark, RN, BSN, CCRP;Benjamen Koelling [place], MA, RCEP, CCRP, Exercise Physiologist;Amanda Oletta Darter, BA, ACSM CEP, Exercise Physiologist   [name]   Medication changes reported      No    Fall or balance concerns reported     No    Warm-up and Cool-down  Performed on first and last piece of equipment    Resistance Training Performed  Yes    VAD Patient?  No    PAD/SET Patient?  No      Pain Assessment   Currently in Pain?  No/denies          Social History   Tobacco Use  Smoking Status Never Smoker  Smokeless Tobacco Never Used    Goals Met:  Independence with exercise equipment Exercise tolerated well Personal goals reviewed No report of cardiac concerns or symptoms Strength training completed today  Goals Unmet:  Not Applicable  Comments: Pt able to follow exercise prescription today without complaint.  Will continue to monitor for progression.    Dr. Emily Filbert is Medical Director for [place] and [facility].

## 2018-07-22 DIAGNOSIS — I214 Non-ST elevation (NSTEMI) myocardial infarction: Secondary | ICD-10-CM | POA: Diagnosis not present

## 2018-07-22 DIAGNOSIS — Z955 Presence of coronary angioplasty implant and graft: Secondary | ICD-10-CM

## 2018-07-22 NOTE — Progress Notes (Signed)
Daily Session Note  Patient Details  Name: Anthony Shepherd MRN: [id number] Date of Birth: [birth year] Referring Provider:     Cardiac Rehab from 06/10/2018 in [facility] Cardiac and Pulmonary Rehab  Referring Provider  Skains      Encounter Date: 07/22/2018  Check In: Session Check In - 07/22/18 0826      Check-In   Supervising physician immediately available to respond to emergencies  See telemetry face sheet for immediately available ER MD    Location  [facility]-Cardiac & Pulmonary Rehab    Staff Present  Gerlene Burdock, RN, BSN;Jessica Luan Pulling, MA, RCEP, CCRP, Exercise Physiologist;Kaylyne Axton Oletta Darter, [place], ACSM CEP, Exercise Physiologist    Medication changes reported      No    Fall or balance concerns reported     No    Warm-up and Cool-down  Performed on first and last piece of equipment    Resistance Training Performed  Yes    VAD Patient?  No    PAD/SET Patient?  No      Pain Assessment   Currently in Pain?  No/denies    Multiple Pain Sites  No          Social History   Tobacco Use  Smoking Status Never Smoker  Smokeless Tobacco Never Used    Goals Met:  Independence with exercise equipment Exercise tolerated well No report of cardiac concerns or symptoms Strength training completed today  Goals Unmet:  Not Applicable  Comments: Pt able to follow exercise prescription today without complaint.  Will continue to monitor for progression.    Dr. Emily Filbert is Medical Director for [place] and [facility].

## 2018-07-26 ENCOUNTER — Ambulatory Visit: Payer: Medicare Other | Admitting: Nurse Practitioner

## 2018-07-26 ENCOUNTER — Encounter: Payer: Self-pay | Admitting: Nurse Practitioner

## 2018-07-26 VITALS — BP 122/80 | HR 66 | Ht 70.5 in | Wt 192.8 lb

## 2018-07-26 DIAGNOSIS — E7849 Other hyperlipidemia: Secondary | ICD-10-CM | POA: Diagnosis not present

## 2018-07-26 DIAGNOSIS — I255 Ischemic cardiomyopathy: Secondary | ICD-10-CM

## 2018-07-26 DIAGNOSIS — I2510 Atherosclerotic heart disease of native coronary artery without angina pectoris: Secondary | ICD-10-CM | POA: Diagnosis not present

## 2018-07-26 DIAGNOSIS — I2583 Coronary atherosclerosis due to lipid rich plaque: Secondary | ICD-10-CM | POA: Diagnosis not present

## 2018-07-26 LAB — HEPATIC FUNCTION PANEL
ALT: 35 IU/L (ref 0–44)
AST: 24 IU/L (ref 0–40)
Albumin: 3.8 g/dL (ref 3.5–4.8)
Alkaline Phosphatase: 48 IU/L (ref 39–117)
Bilirubin Total: 1.5 mg/dL — ABNORMAL HIGH (ref 0.0–1.2)
Bilirubin, Direct: 0.27 mg/dL (ref 0.00–0.40)
Total Protein: 6.3 g/dL (ref 6.0–8.5)

## 2018-07-26 LAB — BASIC METABOLIC PANEL
BUN/Creatinine Ratio: 13 (ref 10–24)
BUN: 12 mg/dL (ref 8–27)
CO2: 24 mmol/L (ref 20–29)
Calcium: 8.4 mg/dL — ABNORMAL LOW (ref 8.6–10.2)
Chloride: 101 mmol/L (ref 96–106)
Creatinine, Ser: 0.96 mg/dL (ref 0.76–1.27)
GFR calc Af Amer: 91 mL/min/{1.73_m2} (ref >59)
GFR calc non Af Amer: 79 mL/min/{1.73_m2} (ref >59)
Glucose: 94 mg/dL (ref 65–99)
Potassium: 4.4 mmol/L (ref 3.5–5.2)
Sodium: 138 mmol/L (ref 134–144)

## 2018-07-26 LAB — CBC
Hematocrit: 41.4 % (ref 37.5–51.0)
Hemoglobin: 14.4 g/dL (ref 13.0–17.7)
MCH: 32.7 pg (ref 26.6–33.0)
MCHC: 34.8 g/dL (ref 31.5–35.7)
MCV: 94 fL (ref 79–97)
Platelets: 233 10*3/uL (ref 150–450)
RBC: 4.4 x10E6/uL (ref 4.14–5.80)
RDW: 12.1 % — ABNORMAL LOW (ref 12.3–15.4)
WBC: 10 10*3/uL (ref 3.4–10.8)

## 2018-07-26 LAB — LIPID PANEL
Chol/HDL Ratio: 2.6 ratio (ref 0.0–5.0)
Cholesterol, Total: 85 mg/dL — ABNORMAL LOW (ref 100–199)
HDL: 33 mg/dL — ABNORMAL LOW (ref >39)
LDL Calculated: 36 mg/dL (ref 0–99)
Triglycerides: 79 mg/dL (ref 0–149)
VLDL Cholesterol Cal: 16 mg/dL (ref 5–40)

## 2018-07-26 MED ORDER — LOSARTAN POTASSIUM 50 MG PO TABS: 50.0000 mg | ORAL_TABLET | Freq: Every day | ORAL | 3 refills | Status: DC

## 2018-07-26 NOTE — Patient Instructions (Addendum)
We will be checking the following labs today - BMET, CBC, HPF and Lipids  If you have labs (blood work) drawn today and your tests are completely normal, you will receive your results only by: Marland Kitchen. MyChart Message (if you have MyChart) OR . A paper copy in the mail If you have any lab test that is abnormal or we need to change your treatment, we will call you to review the results.   Medication Instructions:    Continue with your current medicines. BUT  I am increasing the Losartan to 50 mg a day - you can take 2 of the 25 mg tablets to use up.    If you need a refill on your cardiac medications before your next appointment, please call your pharmacy.     Testing/Procedures To Be Arranged:  Echo as planned  Follow-Up:   See Dr. Anne FuSkains as planned.     At [facility], you and your health needs are our priority.  As part of our continuing mission to provide you with exceptional heart care, we have created designated Provider Care Teams.  These Care Teams include your primary Cardiologist (physician) and Advanced Practice Providers (APPs -  Physician Assistants and Nurse Practitioners) who all work together to provide you with the care you need, when you need it.  Special Instructions:  . None  Call the [facility] office at [phone] if you have any questions, problems or concerns.

## 2018-07-26 NOTE — Progress Notes (Signed)
CARDIOLOGY OFFICE NOTE  Date:  07/26/2018    [name] Date of Birth: 12-20-1945 Medical Record #[id number]  PCP:  Kirby Funk, MD  Cardiologist:  Tyrone Sage [facility]   Chief Complaint  Patient presents with  . Coronary Artery Disease    Seen for Dr. Anne Fu    History of Present Illness: Anthony Shepherd is a 72 y.o. male who presents today for a follow up visit.  Seen for Dr. Anne Fu.   He has a history of CAD with recent admission in September with NSTEMI with PCI to the mid LAD - had LV thrombus noted. EF of 35 to 40%. He was transitioned to Plavix along with anticoagulation with Eliquis. Aspirin to be stopped after one month therapy.   Cath EF was originally reported normal on hand-injection, however echocardiogram showed EF of 35 to 40% with mild MR and TR. Beta blocker was started. No ACE inhibitor because of blood pressure at that time.Other issues include glaucoma, HTN and HLD.   Seen in September by Dr. Anne Fu - doing well. Fish oil stopped due to triple therapy anticoagulation. Some subtle discomfort reported. I then saw him earlier this month - lots of concerns - cough, chest pain, was still on his aspirin (in the setting of triple therapy). I changed him over to ARB and stopped his aspirin.   Comes in today. Here alone. He is doing well. No chest pain. Enjoys cardiac rehab. BP is typically ok - he has had one spell that the staff made him drink water but otherwise, ok. He has no real complaints. His cough did improve. He feels like he is doing well and has no real complaint today. No bleeding/bruising.   Past Medical History:  Diagnosis Date  . HLD (hyperlipidemia)   . Ischemic cardiomyopathy   . Ischemic heart disease     Past Surgical History:  Procedure Laterality Date  . CORONARY STENT INTERVENTION N/A 05/31/2018   Procedure: CORONARY STENT INTERVENTION;  Surgeon: Lyn Records, MD;  Location: [facility] INVASIVE CV LAB;  Service: Cardiovascular;   Laterality: N/A;  . LEFT HEART CATH AND CORONARY ANGIOGRAPHY N/A 05/31/2018   Procedure: LEFT HEART CATH AND CORONARY ANGIOGRAPHY;  Surgeon: Lyn Records, MD;  Location: [facility] INVASIVE CV LAB;  Service: Cardiovascular;  Laterality: N/A;  . ULTRASOUND GUIDANCE FOR VASCULAR ACCESS  05/31/2018   Procedure: Ultrasound Guidance For Vascular Access;  Surgeon: Lyn Records, MD;  Location[facility] INVASIVE CV LAB;  Service: Cardiovascular;;     Medications: Current Meds  Medication Sig  . apixaban (ELIQUIS) 5 MG TABS tablet Take 1 tablet (5 mg total) by mouth 2 (two) times daily.  Marland Kitchen atorvastatin (LIPITOR) 80 MG tablet Take 1 tablet (80 mg total) by mouth daily at 6 PM.  . bimatoprost (LUMIGAN) 0.01 % SOLN Place 1 drop into both eyes at bedtime.  . clopidogrel (PLAVIX) 75 MG tablet Take 1 tablet (75 mg total) by mouth daily.  . Coenzyme Q10 (CO Q 10 PO) Take 1 tablet by mouth daily.  . Cyanocobalamin (VITAMIN B-12 PO) Take 1 tablet by mouth daily.  . hyoscyamine (LEVSIN SL) 0.125 MG SL tablet Place 0.125 mg under the tongue daily as needed for cramping.   . metoprolol succinate (TOPROL-XL) 25 MG 24 hr tablet Take 1 tablet (25 mg total) by mouth daily.  . Multiple Vitamin (MULTIVITAMIN WITH MINERALS) TABS tablet Take 1 tablet by mouth daily.  . naphazoline-pheniramine (NAPHCON-A) 0.025-0.3 % ophthalmic solution Place  1 drop into both eyes 5 (five) times daily as needed for eye irritation (dry eyes/seasonal allergies).  . nitroGLYCERIN (NITROSTAT) 0.4 MG SL tablet Place 1 tablet (0.4 mg total) under the tongue every 5 (five) minutes x 3 doses as needed for chest pain.  . vitamin C (ASCORBIC ACID) 500 MG tablet Take 500 mg by mouth daily.  . vitamin E 400 UNIT capsule Take 400 Units by mouth daily.  . [DISCONTINUED] losartan (COZAAR) 25 MG tablet Take 1 tablet (25 mg total) by mouth daily.     Allergies: Allergies  Allergen Reactions  . Ace Inhibitors     Has had cough    Social History: The  patient  reports that he has never smoked. He has never used smokeless tobacco. He reports that he does not drink alcohol or use drugs.   Family History: The patient's family history includes Colon cancer in his father; Dementia in his mother.   Review of Systems: Please see the history of present illness.   Otherwise, the review of systems is positive for none.   All other systems are reviewed and negative.   Physical Exam: VS:  BP 122/80 (BP Location: Left Arm, Patient Position: Sitting, Cuff Size: Normal)   Pulse 66   Ht 5' 10.5" (1.791 m)   Wt 192 lb 12.8 oz (87.5 kg)   SpO2 98% Comment: at rest  BMI 27.27 kg/m  .  BMI Body mass index is 27.27 kg/m.  Wt Readings from Last 3 Encounters:  07/26/18 192 lb 12.8 oz (87.5 kg)  07/12/18 189 lb (85.7 kg)  06/10/18 190 lb 8 oz (86.4 kg)    General: Pleasant. Well developed, well nourished and in no acute distress.   HEENT: Normal.  Neck: Supple, no JVD, carotid bruits, or masses noted.  Cardiac: Regular rate and rhythm. No murmurs, rubs, or gallops. No edema.  Respiratory:  Lungs are clear to auscultation bilaterally with normal work of breathing.  GI: Soft and nontender.  MS: No deformity or atrophy. Gait and ROM intact.  Skin: Warm and dry. Color is normal.  Neuro:  Strength and sensation are intact and no gross focal deficits noted.  Psych: Alert, appropriate and with normal affect.   LABORATORY DATA:  EKG:  EKG is not ordered today.  Lab Results  Component Value Date   WBC 11.9 (H) 06/01/2018   HGB 13.2 06/01/2018   HCT 40.1 06/01/2018   PLT 195 06/01/2018   GLUCOSE 100 (H) 06/01/2018   CHOL 129 05/31/2018   TRIG 156 (H) 05/31/2018   HDL 30 (L) 05/31/2018   LDLCALC 68 05/31/2018   NA 140 06/01/2018   K 3.7 06/01/2018   CL 107 06/01/2018   CREATININE 0.91 06/01/2018   BUN 12 06/01/2018   CO2 26 06/01/2018   INR 1.18 05/31/2018     BNP (last 3 results) No results for input(s): BNP in the last 8760  hours.  ProBNP (last 3 results) No results for input(s): PROBNP in the last 8760 hours.   Other Studies Reviewed Today:  CORONARY STENT INTERVENTION 05/2018  LEFT HEART CATH AND CORONARY ANGIOGRAPHY  Conclusion     A stent was successfully placed.   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  Short left main without significant obstruction  Circumflex with luminal irregularities in the proximal vessel but no high-grade obstruction.  Dominant normal right coronary.  Normal left ventricular size. LVEDP 8 mmHg.  Successful drug-eluting stent implantation mid LAD  segment reducing the 95% stenosis to 0% with TIMI grade III flow. Synergy 16 x 3.5 mm stent postdilated to 3.75 mm at the distal margin was deployed.  RECOMMENDATIONS:   Aspirin and Brilinta for 12 months. Would be okay to switch to Plavix after 2 to 3 months assuming no complications/side effects related to Brilinta.  Aggressive risk factor modification.  Probable discharge in a.m. if uneventful course  Recommend dual antiplatelet therapy with Aspirin 81mg  daily and Ticagrelor 90mg  twice dailylong-term (beyond 12 months) because of ACS presentation.   Echo Study Conclusions 05/2018  - Left ventricle: The cavity size was normal. Systolic function was moderately reduced. The estimated ejection fraction was in the range of 35% to 40%. There is akinesis of the midanteroseptal and inferoseptal myocardium. There is akinesis of the apical anterior, apical septal and apical myocardium. There was an increased relative contribution of atrial contraction to ventricular filling. Doppler parameters are consistent with abnormal left ventricular relaxation (grade 1 diastolic dysfunction). - Aortic valve: Trileaflet; mildly thickened, mildly calcified leaflets. - Mitral valve: There was mild regurgitation. - Right ventricle: Systolic function was mildly reduced. -  Tricuspid valve: There was mild regurgitation.  Recommendations: Recommend definity contrast study to rule out apical LV thrombus.   Echo Limited Study Conclusions 05/2018  - Left ventricle: Possible early forming thrombus in LV apex seen best on apical 3 chamber view. Systolic function was moderately to severely reduced. The estimated ejection fraction was 35%. There is akinesis of the midanteroseptal and inferoseptal myocardium. There is akinesis of the apicalanterior and apical myocardium.    Assessment/Plan:  1. CAD with recent NSTEMI with PCI/DES to mLAD - he is doing well clinically - he had basically single vessel disease - no further chest pain.   2. Ischemic CM - EF of 35 to 40% per echo - now on ARB - will try to increase - BP may limit but I think we should try.   3. High risk medicine -  No longer on aspirin. To stay on Eliquis/Plavix   4. LV thrombus - to have repeat echo 3 months from original event - noted per Dr. Anne Fu that "If thrombus has resolved, we will stop Eliquis and resume aspirin to reinitiate dual antiplatelet therapy with aspirin and Plavix". This is scheduled for next month.   5. HLD - on statin therapy - checking lab today.   Current medicines are reviewed with the patient today.  The patient does not have concerns regarding medicines other than what has been noted above.  The following changes have been made:  See above.  Labs/ tests ordered today include:    Orders Placed This Encounter  Procedures  . Basic metabolic panel  . CBC  . Hepatic function panel  . Lipid panel     Disposition:   FU with Dr. Anne Fu as planned.  I am happy to see back as needed.   Patient is agreeable to this plan and will call if any problems develop in the interim.   SignedNorma Fredrickson, NP  07/26/2018 10:48 AM  [facility] [address] [place], [place]  [address] Phone: [phone] Fax: [phone]

## 2018-07-27 ENCOUNTER — Other Ambulatory Visit: Payer: Self-pay | Admitting: *Deleted

## 2018-07-27 DIAGNOSIS — Z79899 Other long term (current) drug therapy: Secondary | ICD-10-CM

## 2018-07-27 DIAGNOSIS — Z955 Presence of coronary angioplasty implant and graft: Secondary | ICD-10-CM

## 2018-07-27 DIAGNOSIS — I214 Non-ST elevation (NSTEMI) myocardial infarction: Secondary | ICD-10-CM | POA: Diagnosis not present

## 2018-07-27 NOTE — Progress Notes (Signed)
Daily Session Note  Patient Details  Name: Anthony Shepherd MRN: [id number] Date of Birth: 03-Jan-1946 Referring Provider:     Cardiac Rehab from 06/10/2018 in [facility] Cardiac and Pulmonary Rehab  Referring Provider  Skains      Encounter Date: 07/27/2018  Check In: Session Check In - 07/27/18 1047      Check-In   Supervising physician immediately available to respond to emergencies  See telemetry face sheet for immediately available ER MD    Location  [facility]-Cardiac & Pulmonary Rehab    Staff Present  Heath Lark, RN, BSN, CCRP;Jessica Kerens, MA, RCEP, CCRP, Exercise Physiologist;Jeanna Durrell BS, Exercise Physiologist    Medication changes reported      Yes    Comments  Losartin increased to 50 mg    Fall or balance concerns reported     No    Warm-up and Cool-down  Performed on first and last piece of equipment    Resistance Training Performed  Yes    VAD Patient?  No    PAD/SET Patient?  No      Pain Assessment   Currently in Pain?  No/denies          Social History   Tobacco Use  Smoking Status Never Smoker  Smokeless Tobacco Never Used    Goals Met:  Independence with exercise equipment Exercise tolerated well No report of cardiac concerns or symptoms Strength training completed today  Goals Unmet:  Not Applicable  Comments: Pt able to follow exercise prescription today without complaint.  Will continue to monitor for progression.   Dr. Emily Filbert is Medical Director for [place] and [facility].

## 2018-07-29 ENCOUNTER — Encounter: Payer: Medicare Other | Admitting: *Deleted

## 2018-07-29 DIAGNOSIS — I214 Non-ST elevation (NSTEMI) myocardial infarction: Secondary | ICD-10-CM

## 2018-07-29 DIAGNOSIS — Z955 Presence of coronary angioplasty implant and graft: Secondary | ICD-10-CM

## 2018-07-29 NOTE — Progress Notes (Signed)
Daily Session Note  Patient Details  Name: Anthony Shepherd MRN: [id number] Date of Birth: [birth year] Referring Provider:     Cardiac Rehab from 06/10/2018 in [facility] Cardiac and Pulmonary Rehab  Referring Provider  Skains      Encounter Date: 07/29/2018  Check In: Session Check In - 07/29/18 0828      Check-In   Supervising physician immediately available to respond to emergencies  See telemetry face sheet for immediately available ER MD    Location  [facility]-Cardiac & Pulmonary Rehab    Staff Present  Gerlene Burdock, RN, BSN;Jeanna Durrell BS, Exercise Physiologist    Medication changes reported      No    Fall or balance concerns reported     No    Warm-up and Cool-down  Performed on first and last piece of equipment    Resistance Training Performed  Yes    VAD Patient?  No    PAD/SET Patient?  No      Pain Assessment   Currently in Pain?  No/denies          Social History   Tobacco Use  Smoking Status Never Smoker  Smokeless Tobacco Never Used    Goals Met:  Independence with exercise equipment Exercise tolerated well No report of cardiac concerns or symptoms Strength training completed today  Goals Unmet:  Not Applicable  Comments: Pt able to follow exercise prescription today without complaint.  Will continue to monitor for progression.    Dr. Emily Filbert is Medical Director for [place] and [facility].

## 2018-08-03 ENCOUNTER — Encounter: Payer: Medicare Other | Admitting: *Deleted

## 2018-08-03 DIAGNOSIS — I214 Non-ST elevation (NSTEMI) myocardial infarction: Secondary | ICD-10-CM

## 2018-08-03 DIAGNOSIS — Z955 Presence of coronary angioplasty implant and graft: Secondary | ICD-10-CM

## 2018-08-03 NOTE — Progress Notes (Signed)
Daily Session Note  Patient Details  Name: Anthony Shepherd MRN: [id number] Date of Birth: 08/23/1946 Referring Provider:     Cardiac Rehab from 06/10/2018 in [facility] Cardiac and Pulmonary Rehab  Referring Provider  Skains      Encounter Date: 08/03/2018  Check In: Session Check In - 08/03/18 0807      Check-In   Supervising physician immediately available to respond to emergencies  See telemetry face sheet for immediately available ER MD    Location  [facility]-Cardiac & Pulmonary Rehab    Staff Present  Heath Lark, RN, BSN, CCRP;Quiera Diffee [place], MA, RCEP, CCRP, Exercise Physiologist    Medication changes reported      No    Fall or balance concerns reported     No    Warm-up and Cool-down  Performed as group-led instruction    Resistance Training Performed  Yes    VAD Patient?  No    PAD/SET Patient?  No          Social History   Tobacco Use  Smoking Status Never Smoker  Smokeless Tobacco Never Used    Goals Met:  Independence with exercise equipment Exercise tolerated well No report of cardiac concerns or symptoms Strength training completed today  Goals Unmet:  Not Applicable  Comments: Pt able to follow exercise prescription today without complaint.  Will continue to monitor for progression.    Dr. Emily Filbert is Medical Director for [place] and [facility].

## 2018-08-04 DIAGNOSIS — I214 Non-ST elevation (NSTEMI) myocardial infarction: Secondary | ICD-10-CM

## 2018-08-04 NOTE — Progress Notes (Signed)
Daily Session Note  Patient Details  Name: NUR KRASINSKI MRN: [id number] Date of Birth: [birth year] Referring Provider:     Cardiac Rehab from 06/10/2018 in [facility] Cardiac and Pulmonary Rehab  Referring Provider  Skains      Encounter Date: 08/04/2018  Check In: Session Check In - 08/04/18 0842      Check-In   Supervising physician immediately available to respond to emergencies  See telemetry face sheet for immediately available ER MD    Location  [facility]-Cardiac & Pulmonary Rehab    Staff Present  Justin Mend RCP,RRT,BSRT;Heath Lark, RN, BSN, CCRP;Laureen Owens Shark, BS, RRT, Respiratory Therapist    Medication changes reported      No    Fall or balance concerns reported     No    Warm-up and Cool-down  Performed as group-led instruction    Resistance Training Performed  Yes    VAD Patient?  No    PAD/SET Patient?  No      Pain Assessment   Currently in Pain?  No/denies          Social History   Tobacco Use  Smoking Status Never Smoker  Smokeless Tobacco Never Used    Goals Met:  Independence with exercise equipment Exercise tolerated well No report of cardiac concerns or symptoms Strength training completed today  Goals Unmet:  Not Applicable  Comments: Pt able to follow exercise prescription today without complaint.  Will continue to monitor for progression.    Dr. Emily Filbert is Medical Director for [place] and [facility].

## 2018-08-10 ENCOUNTER — Encounter: Payer: Medicare Other | Attending: Cardiology

## 2018-08-10 DIAGNOSIS — Z79899 Other long term (current) drug therapy: Secondary | ICD-10-CM | POA: Diagnosis not present

## 2018-08-10 DIAGNOSIS — Z7982 Long term (current) use of aspirin: Secondary | ICD-10-CM | POA: Diagnosis not present

## 2018-08-10 DIAGNOSIS — I214 Non-ST elevation (NSTEMI) myocardial infarction: Secondary | ICD-10-CM | POA: Diagnosis not present

## 2018-08-10 DIAGNOSIS — Z955 Presence of coronary angioplasty implant and graft: Secondary | ICD-10-CM | POA: Diagnosis present

## 2018-08-10 NOTE — Progress Notes (Signed)
Daily Session Note  Patient Details  Name: Anthony Shepherd MRN: [id number] Date of Birth: May 05, 1946 Referring Provider:     Cardiac Rehab from 06/10/2018 in [facility] Cardiac and Pulmonary Rehab  Referring Provider  Skains      Encounter Date: 08/10/2018  Check In: Session Check In - 08/10/18 0755      Check-In   Supervising physician immediately available to respond to emergencies  See telemetry face sheet for immediately available ER MD    Location  [facility]-Cardiac & Pulmonary Rehab    Staff Present  Heath Lark, RN, BSN, CCRP;Jeanna Durrell BS, Exercise Physiologist;Jessica [place], MA, RCEP, CCRP, Exercise Physiologist;Bellami Farrelly Oletta Darter, BA, ACSM CEP, Exercise Physiologist    Medication changes reported      No    Fall or balance concerns reported     No    Warm-up and Cool-down  Performed as group-led instruction    Resistance Training Performed  Yes    VAD Patient?  No    PAD/SET Patient?  No      Pain Assessment   Currently in Pain?  No/denies    Multiple Pain Sites  No          Social History   Tobacco Use  Smoking Status Never Smoker  Smokeless Tobacco Never Used    Goals Met:  Independence with exercise equipment Exercise tolerated well No report of cardiac concerns or symptoms Strength training completed today  Goals Unmet:  Not Applicable  Comments: Pt able to follow exercise prescription today without complaint.  Will continue to monitor for progression.    Dr. Emily Filbert is Medical Director for [place] and [facility].

## 2018-08-11 ENCOUNTER — Encounter: Payer: Self-pay | Admitting: *Deleted

## 2018-08-11 DIAGNOSIS — I214 Non-ST elevation (NSTEMI) myocardial infarction: Secondary | ICD-10-CM

## 2018-08-11 DIAGNOSIS — Z955 Presence of coronary angioplasty implant and graft: Secondary | ICD-10-CM

## 2018-08-11 NOTE — Progress Notes (Signed)
Cardiac Individual Treatment Plan  Patient Details  Name: Anthony Shepherd MRN: [id number] Date of Birth: 06-13-1946 Referring Provider:     Cardiac Rehab from 06/10/2018 in [facility] Cardiac and Pulmonary Rehab  Referring Provider  Skains      Initial Encounter Date:    Cardiac Rehab from 06/10/2018 in [facility] Cardiac and Pulmonary Rehab  Date  06/10/18      Visit Diagnosis: NSTEMI (non-ST elevated myocardial infarction) [facility])  Status post coronary artery stent placement  Patient's Home Medications on Admission:  Current Outpatient Medications:  .  apixaban (ELIQUIS) 5 MG TABS tablet, Take 1 tablet (5 mg total) by mouth 2 (two) times daily., Disp: 60 tablet, Rfl: 3 .  atorvastatin (LIPITOR) 80 MG tablet, Take 1 tablet (80 mg total) by mouth daily at 6 PM., Disp: 90 tablet, Rfl: 3 .  bimatoprost (LUMIGAN) 0.01 % SOLN, Place 1 drop into both eyes at bedtime., Disp: , Rfl:  .  clopidogrel (PLAVIX) 75 MG tablet, Take 1 tablet (75 mg total) by mouth daily., Disp: 30 tablet, Rfl: 6 .  Coenzyme Q10 (CO Q 10 PO), Take 1 tablet by mouth daily., Disp: , Rfl:  .  Cyanocobalamin (VITAMIN B-12 PO), Take 1 tablet by mouth daily., Disp: , Rfl:  .  hyoscyamine (LEVSIN SL) 0.125 MG SL tablet, Place 0.125 mg under the tongue daily as needed for cramping. , Disp: , Rfl:  .  losartan (COZAAR) 50 MG tablet, Take 1 tablet (50 mg total) by mouth daily., Disp: 90 tablet, Rfl: 3 .  metoprolol succinate (TOPROL-XL) 25 MG 24 hr tablet, Take 1 tablet (25 mg total) by mouth daily., Disp: 30 tablet, Rfl: 3 .  Multiple Vitamin (MULTIVITAMIN WITH MINERALS) TABS tablet, Take 1 tablet by mouth daily., Disp: , Rfl:  .  naphazoline-pheniramine (NAPHCON-A) 0.025-0.3 % ophthalmic solution, Place 1 drop into both eyes 5 (five) times daily as needed for eye irritation (dry eyes/seasonal allergies)., Disp: , Rfl:  .  nitroGLYCERIN (NITROSTAT) 0.4 MG SL tablet, Place 1 tablet (0.4 mg total) under the tongue every 5 (five) minutes x 3  doses as needed for chest pain., Disp: 25 tablet, Rfl: 3 .  vitamin C (ASCORBIC ACID) 500 MG tablet, Take 500 mg by mouth daily., Disp: , Rfl:  .  vitamin E 400 UNIT capsule, Take 400 Units by mouth daily., Disp: , Rfl:   Past Medical History: Past Medical History:  Diagnosis Date  . HLD (hyperlipidemia)   . Ischemic cardiomyopathy   . Ischemic heart disease     Tobacco Use: Social History   Tobacco Use  Smoking Status Never Smoker  Smokeless Tobacco Never Used    Labs: Recent Review Scientist, physiological    Labs for ITP Cardiac and Pulmonary Rehab Latest Ref Rng & Units 05/31/2018 07/26/2018   Cholestrol 100 - 199 mg/dL 129 85(L)   LDLCALC 0 - 99 mg/dL 68 36   HDL >39 mg/dL 30(L) 33(L)   Trlycerides 0 - 149 mg/dL 156(H) 79       Exercise Target Goals: Exercise Program Goal: Individual exercise prescription set using results from initial 6 min walk test and THRR while considering  patient's activity barriers and safety.   Exercise Prescription Goal: Initial exercise prescription builds to 30-45 minutes a day of aerobic activity, 2-3 days per week.  Home exercise guidelines will be given to patient during program as part of exercise prescription that the participant will acknowledge.  Activity Barriers & Risk Stratification:   6 Minute  Walk: 6 Minute Walk    Row Name 06/10/18 1337         6 Minute Walk   Phase  Initial     Distance  1400 feet     Distance % Change  0 %     Distance Feet Change  0 ft     Walk Time  6 minutes     # of Rest Breaks  0     MPH  2.65     METS  3.25     RPE  14     Perceived Dyspnea   0     VO2 Peak  11.38     Symptoms  No     Resting HR  59 bpm     Resting BP  112/68     Resting Oxygen Saturation   98 %     Max Ex. HR  107 bpm     Max Ex. BP  132/60     2 Minute Post BP  110/64        Oxygen Initial Assessment:   Oxygen Re-Evaluation:   Oxygen Discharge (Final Oxygen Re-Evaluation):   Initial Exercise  Prescription: Initial Exercise Prescription - 06/10/18 1300      Date of Initial Exercise RX and Referring Provider   Date  06/10/18    Referring Provider  Skains      Treadmill   MPH  2.6    Grade  0.5    Minutes  15    METs  3.17      Recumbant Bike   Level  4    RPM  60    Watts  34    Minutes  15    METs  3.25      NuStep   Level  3    SPM  80    Minutes  15    METs  3.25      Prescription Details   Frequency (times per week)  3    Duration  Progress to 30 minutes of continuous aerobic without signs/symptoms of physical distress      Intensity   THRR 40-80% of Max Heartrate  95-131    Ratings of Perceived Exertion  11-15    Perceived Dyspnea  0-4      Progression   Progression  Continue to progress workloads to maintain intensity without signs/symptoms of physical distress.      Resistance Training   Training Prescription  Yes    Weight  3 lb    Reps  10-15       Perform Capillary Blood Glucose checks as needed.  Exercise Prescription Changes: Exercise Prescription Changes    Row Name 06/10/18 1300 07/01/18 0800 07/05/18 1300 07/20/18 1300 08/02/18 1500     Response to Exercise   Blood Pressure (Admit)  112/68  -  132/70  122/74  108/70   Blood Pressure (Exercise)  132/60  -  136/70  110/76  136/78   Blood Pressure (Exit)  110/64  -  126/70  134/60  98/60   Heart Rate (Admit)  59 bpm  -  92 bpm  100 bpm  87 bpm   Heart Rate (Exercise)  107 bpm  -  119 bpm  137 bpm  125 bpm   Heart Rate (Exit)  60 bpm  -  91 bpm  106 bpm  95 bpm   Oxygen Saturation (Admit)  98 %  -  -  -  -  Rating of Perceived Exertion (Exercise)  14  -  '13  13  13   '$ Perceived Dyspnea (Exercise)  0  -  -  -  -   Symptoms  none  -  none  none  none   Duration  -  -  Continue with 30 min of aerobic exercise without signs/symptoms of physical distress.  Continue with 30 min of aerobic exercise without signs/symptoms of physical distress.  Continue with 30 min of aerobic exercise without  signs/symptoms of physical distress.   Intensity  -  -  THRR unchanged  THRR unchanged  THRR unchanged     Progression   Progression  -  -  Continue to progress workloads to maintain intensity without signs/symptoms of physical distress.  Continue to progress workloads to maintain intensity without signs/symptoms of physical distress.  Continue to progress workloads to maintain intensity without signs/symptoms of physical distress.   Average METs  -  -  3.53  3.86  3.75     Resistance Training   Training Prescription  -  -  Yes  Yes  Yes   Weight  -  -  4 lbs  4 lbs  4 lbs   Reps  -  -  10-15  10-15  10-15     Interval Training   Interval Training  -  -  No  No  No     Treadmill   MPH  -  -  2.'5  3  3   '$ Grade  -  -  '1  1  1   '$ Minutes  -  -  '15  15  15   '$ METs  -  -  3.26  3.71  3.71     Recumbant Bike   Level  -  -  4  4  -   RPM  -  -  50  -  -   Minutes  -  -  15  15  -     NuStep   Level  -  -  '3  5  6   '$ Minutes  -  -  '15  15  15   '$ METs  -  -  3.8  4  3.8     Home Exercise Plan   Plans to continue exercise at  -  Home (comment) Recumbent Bike, walking, treadmill  Home (comment) Recumbent Bike, walking, treadmill  Home (comment) Recumbent Bike, walking, treadmill  Home (comment) Recumbent Bike, walking, treadmill   Frequency  -  Add 3 additional days to program exercise sessions.  Add 3 additional days to program exercise sessions.  Add 3 additional days to program exercise sessions.  Add 3 additional days to program exercise sessions.   Initial Home Exercises Provided  -  07/01/18  07/01/18  07/01/18  07/01/18      Exercise Comments: Exercise Comments    Row Name 06/22/18 0949           Exercise Comments  First full day of exercise!  Patient was oriented to gym and equipment including functions, settings, policies, and procedures.  Patient's individual exercise prescription and treatment plan were reviewed.  All starting workloads were established based on the results  of the 6 minute walk test done at initial orientation visit.  The plan for exercise progression was also introduced and progression will be customized based on patient's performance and goals.          Exercise Goals and Review:  Exercise Goals    Row Name 06/10/18 1336             Exercise Goals   Increase Physical Activity  Yes       Intervention  Provide advice, education, support and counseling about physical activity/exercise needs.;Develop an individualized exercise prescription for aerobic and resistive training based on initial evaluation findings, risk stratification, comorbidities and participant's personal goals.       Expected Outcomes  Short Term: Attend rehab on a regular basis to increase amount of physical activity.;Long Term: Add in home exercise to make exercise part of routine and to increase amount of physical activity.;Long Term: Exercising regularly at least 3-5 days a week.       Increase Strength and Stamina  Yes       Intervention  Provide advice, education, support and counseling about physical activity/exercise needs.;Develop an individualized exercise prescription for aerobic and resistive training based on initial evaluation findings, risk stratification, comorbidities and participant's personal goals.       Expected Outcomes  Short Term: Increase workloads from initial exercise prescription for resistance, speed, and METs.;Short Term: Perform resistance training exercises routinely during rehab and add in resistance training at home;Long Term: Improve cardiorespiratory fitness, muscular endurance and strength as measured by increased METs and functional capacity (6MWT)       Able to understand and use rate of perceived exertion (RPE) scale  Yes       Intervention  Provide education and explanation on how to use RPE scale       Expected Outcomes  Short Term: Able to use RPE daily in rehab to express subjective intensity level;Long Term:  Able to use RPE to guide  intensity level when exercising independently       Able to understand and use Dyspnea scale  Yes       Intervention  Provide education and explanation on how to use Dyspnea scale       Expected Outcomes  Short Term: Able to use Dyspnea scale daily in rehab to express subjective sense of shortness of breath during exertion;Long Term: Able to use Dyspnea scale to guide intensity level when exercising independently       Knowledge and understanding of Target Heart Rate Range (THRR)  Yes       Intervention  Provide education and explanation of THRR including how the numbers were predicted and where they are located for reference       Expected Outcomes  Short Term: Able to state/look up THRR;Short Term: Able to use daily as guideline for intensity in rehab;Long Term: Able to use THRR to govern intensity when exercising independently       Able to check pulse independently  Yes       Intervention  Provide education and demonstration on how to check pulse in carotid and radial arteries.;Review the importance of being able to check your own pulse for safety during independent exercise       Expected Outcomes  Short Term: Able to explain why pulse checking is important during independent exercise;Long Term: Able to check pulse independently and accurately       Understanding of Exercise Prescription  Yes       Intervention  Provide education, explanation, and written materials on patient's individual exercise prescription       Expected Outcomes  Short Term: Able to explain program exercise prescription;Long Term: Able to explain home exercise prescription to exercise independently  Exercise Goals Re-Evaluation : Exercise Goals Re-Evaluation    Row Name 06/22/18 0949 07/01/18 0829 07/20/18 0757 08/02/18 1555       Exercise Goal Re-Evaluation   Exercise Goals Review  Increase Physical Activity;Increase Strength and Stamina;Able to understand and use rate of perceived exertion (RPE)  scale;Knowledge and understanding of Target Heart Rate Range (THRR);Understanding of Exercise Prescription  Increase Physical Activity;Able to understand and use rate of perceived exertion (RPE) scale;Knowledge and understanding of Target Heart Rate Range (THRR);Understanding of Exercise Prescription;Increase Strength and Stamina;Able to check pulse independently  Increase Physical Activity;Increase Strength and Stamina;Understanding of Exercise Prescription  Increase Physical Activity;Increase Strength and Stamina;Understanding of Exercise Prescription    Comments  Reviewed RPE scale, THR and program prescription with pt today.  Pt voiced understanding and was given a copy of goals to take home.   Reviewed home exercise with pt today.  Pt plans to use recumbent bike, walk outisde, and on the treadmill for exercise.  Reviewed THR, pulse, RPE, sign and symptoms, NTG use, and when to call 911 or MD.  Also discussed weather considerations and indoor options.  Pt voiced understanding.  Anthony Shepherd is doing well in rehab.  He is walking for a mile and using his recumbent bike on his off days from rehab.  He is feeling like his strength and stamina are coming back and recovering.  He has also noticed that his SOB has improved as well.   Anthony Shepherd continues to do well in rehab.  He is up to level 6 on the NuStep.  We will continue to monitor his progression.     Expected Outcomes  Short: Use RPE daily to regulate intensity. Long: Follow program prescription in THR.  Short: Start home exercise. Long: Continue to exercise independently.  Short: Continue to walk on off days.  Long: Continue to improve strength and stamina.   Short: Talk about adding in intervals.  Long: Continue to walk more on his off days from rehab.        Discharge Exercise Prescription (Final Exercise Prescription Changes): Exercise Prescription Changes - 08/02/18 1500      Response to Exercise   Blood Pressure (Admit)  108/70    Blood Pressure (Exercise)   136/78    Blood Pressure (Exit)  98/60    Heart Rate (Admit)  87 bpm    Heart Rate (Exercise)  125 bpm    Heart Rate (Exit)  95 bpm    Rating of Perceived Exertion (Exercise)  13    Symptoms  none    Duration  Continue with 30 min of aerobic exercise without signs/symptoms of physical distress.    Intensity  THRR unchanged      Progression   Progression  Continue to progress workloads to maintain intensity without signs/symptoms of physical distress.    Average METs  3.75      Resistance Training   Training Prescription  Yes    Weight  4 lbs    Reps  10-15      Interval Training   Interval Training  No      Treadmill   MPH  3    Grade  1    Minutes  15    METs  3.71      NuStep   Level  6    Minutes  15    METs  3.8      Home Exercise Plan   Plans to continue exercise at  Home (comment)   Recumbent  Bike, walking, treadmill   Frequency  Add 3 additional days to program exercise sessions.    Initial Home Exercises Provided  07/01/18       Nutrition:  Target Goals: Understanding of nutrition guidelines, daily intake of sodium '1500mg'$ , cholesterol '200mg'$ , calories 30% from fat and 7% or less from saturated fats, daily to have 5 or more servings of fruits and vegetables.  Biometrics: Pre Biometrics - 06/10/18 1335      Pre Biometrics   Height  5' 11.5" (1.816 m)    Weight  190 lb 8 oz (86.4 kg)    Waist Circumference  40.5 inches    Hip Circumference  41 inches    Waist to Hip Ratio  0.99 %    BMI (Calculated)  26.2    Single Leg Stand  16.5 seconds        Nutrition Therapy Plan and Nutrition Goals: Nutrition Therapy & Goals - 06/24/18 1155      Nutrition Therapy   Diet  DASH    Drug/Food Interactions  Statins/Certain Fruits    Protein (specify units)  9-10oz    Fiber  35 grams    Whole Grain Foods  3 servings   eats white-wheat bread   Saturated Fats  16 max. grams    Fruits and Vegetables  6 servings/day   8 ideal; eats more vegetables than fruits-  eats fruits every few days   Sodium  1500 grams      Personal Nutrition Goals   Nutrition Goal  Do not add salt to meals after it has been cooked. Eventually, work with your wife to use less salt during the cooking process    Personal Goal #2  When buying canned soups, look for varieties that are lower in sodium. Ideally with '140mg'$  or less per serving    Personal Goal #3  Eat at least one additional serving of fruit per week    Comments  He has been trying to limit salt and overall fat intake. Eats 2-3 meals/day depending on his work schedule. Breakfast options include oatmeal with granola, eggs / bacon/ grits once per week. He and his wife limit red meat and fried food intake. He does not add salt at the table but his wife still cooks with salt.      Intervention Plan   Intervention  Prescribe, educate and counsel regarding individualized specific dietary modifications aiming towards targeted core components such as weight, hypertension, lipid management, diabetes, heart failure and other comorbidities.    Expected Outcomes  Short Term Goal: Understand basic principles of dietary content, such as calories, fat, sodium, cholesterol and nutrients.;Long Term Goal: Adherence to prescribed nutrition plan.;Short Term Goal: A plan has been developed with personal nutrition goals set during dietitian appointment.       Nutrition Assessments: Nutrition Assessments - 06/10/18 1411      MEDFICTS Scores   Pre Score  49       Nutrition Goals Re-Evaluation: Nutrition Goals Re-Evaluation    Row Name 06/24/18 1232 07/20/18 1610           Goals   Nutrition Goal  When buying canned soups, look for varieties that are lower in sodium. Ideally with '140mg'$  or less per serving  Do not add salt to meals after they have been cooked; look for lower sodium canned soups; eat at least one additional serving of fruit per week      Comment  He has been buying canned soups but has not  checked the sodium content on  the label  He and his wife have been trying to cook with less salt. Last night for supper they had roasted vegetables with garlic and olive oil + rotisserie chicken. He has not been eating canned soups as often, but plans to buy lower sodium varietites when he is in the mood for soup again. They have not been purchasing red meats recently. Has been having yogurt daily with or without fruit      Expected Outcome  He will choose canned items/soups with '140mg'$  of sodium or less per serving  He will continue to work with his wife to cook with less salt, choose lean meats, and include vegetables regularly. He will continue to add fruits more regularly and not add salt to meals after cooking. He and his wife will look for low sodium options when shopping IE canned soups        Personal Goal #2 Re-Evaluation   Personal Goal #2  Do not add salt to meals after it has been cooked. Eventually work with your wife to use less salt during the cooking process  -        Personal Goal #3 Re-Evaluation   Personal Goal #3  Eat at least one additional serving of fruit per week  -         Nutrition Goals Discharge (Final Nutrition Goals Re-Evaluation): Nutrition Goals Re-Evaluation - 07/20/18 0852      Goals   Nutrition Goal  Do not add salt to meals after they have been cooked; look for lower sodium canned soups; eat at least one additional serving of fruit per week    Comment  He and his wife have been trying to cook with less salt. Last night for supper they had roasted vegetables with garlic and olive oil + rotisserie chicken. He has not been eating canned soups as often, but plans to buy lower sodium varietites when he is in the mood for soup again. They have not been purchasing red meats recently. Has been having yogurt daily with or without fruit    Expected Outcome  He will continue to work with his wife to cook with less salt, choose lean meats, and include vegetables regularly. He will continue to add fruits  more regularly and not add salt to meals after cooking. He and his wife will look for low sodium options when shopping IE canned soups       Psychosocial: Target Goals: Acknowledge presence or absence of significant depression and/or stress, maximize coping skills, provide positive support system. Participant is able to verbalize types and ability to use techniques and skills needed for reducing stress and depression.   Initial Review & Psychosocial Screening: Initial Psych Review & Screening - 06/10/18 1406      Initial Review   Current issues with  None Identified   didn't report any issues during Med Review. Lives a socially active lifestyle and is happy to work on his health to become independent with exercise and heart healthy eating     [place]?  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      Screening Interventions   Interventions  Encouraged to exercise;Program counselor consult;To provide support and resources with identified psychosocial needs;Provide feedback about the scores to  participant    Expected Outcomes  Short Term goal: Utilizing psychosocial counselor, staff and physician to assist with identification of specific Stressors or current issues interfering with healing process. Setting desired goal for each stressor or current issue identified.;Long Term Goal: Stressors or current issues are controlled or eliminated.;Short Term goal: Identification and review with participant of any Quality of Life or Depression concerns found by scoring the questionnaire.;Long Term goal: The participant improves quality of Life and PHQ9 Scores as seen by post scores and/or verbalization of changes       Quality of Life Scores:  Quality of Life - 06/10/18 1407      Quality of  Life   Select  Quality of Life      Quality of Life Scores   Health/Function Pre  29.6 %    Socioeconomic Pre  30 %    Psych/Spiritual Pre  30 %    Family Pre  30 %    GLOBAL Pre  29.82 %      Scores of 19 and below usually indicate a poorer quality of life in these areas.  A difference of  2-3 points is a clinically meaningful difference.  A difference of 2-3 points in the total score of the Quality of Life Index has been associated with significant improvement in overall quality of life, self-image, physical symptoms, and general health in studies assessing change in quality of life.  PHQ-9: Recent Review Flowsheet Data    Depression screen [facility] 2/9 06/10/2018   Decreased Interest 0   Down, Depressed, Hopeless 0   PHQ - 2 Score 0   Altered sleeping 0   Tired, decreased energy 0   Change in appetite 0   Feeling bad or failure about yourself  0   Trouble concentrating 0   Moving slowly or fidgety/restless 0   Suicidal thoughts 0   PHQ-9 Score 0     Interpretation of Total Score  Total Score Depression Severity:  1-4 = Minimal depression, 5-9 = Mild depression, 10-14 = Moderate depression, 15-19 = Moderately severe depression, 20-27 = Severe depression   Psychosocial Evaluation and Intervention: Psychosocial Evaluation - 07/06/18 1011      Psychosocial Evaluation & Interventions   Interventions  Encouraged to exercise with the program and follow exercise prescription    Comments  Counselor met with Anthony Shepherd) today for initial psychosocial evaluation.  He is a 72 year old who had a heart attack recently and a stent inserted.  He reports being in good health other than his cardiac condition and being diagnosed with glaucoma approx. 6 months ago and is being treated with medications which are being effective.  He has a strong support system with a spouse of 53 years; a daughter and sister-in law locally and active involvement in his local church.  Anthony Shepherd reports sleeping well and  having a good appetite.  He denies a history of depression or anxiety and is typically in a positive mood.  Anthony Shepherd has minimal stress in his life at this time; although he works part-time at [employer].  Anthony Shepherd has goals to "get healthy" and develop an exercise routine.  He will be followed by staff.    Expected Outcomes  Short:  Anthony Shepherd will exercise consistently to improve his health overall.   Long:  Anthony Shepherd will develop an exercise routine and maintain this for his health and mental health.    Continue Psychosocial Services   Follow up required by staff  Psychosocial Re-Evaluation: Psychosocial Re-Evaluation    Row Name 07/20/18 0759             Psychosocial Re-Evaluation   Current issues with  Current Stress Concerns       Comments  Anthony Shepherd is doing well in rehab. He is doing well mentally.  He has a great support system in his wife. They celebrated his wife's birthday this weekend at [facility].  He continues to sleep well and is enjoying the cooler temperatures at night.  He does not have any major stressors at this point.       Expected Outcomes  Short: Continue to stay positive.  Long: Continue to deal with health.        Interventions  Encouraged to attend Cardiac Rehabilitation for the exercise       Continue Psychosocial Services   Follow up required by staff          Psychosocial Discharge (Final Psychosocial Re-Evaluation): Psychosocial Re-Evaluation - 07/20/18 0759      Psychosocial Re-Evaluation   Current issues with  Current Stress Concerns    Comments  Anthony Shepherd is doing well in rehab. He is doing well mentally.  He has a great support system in his wife. They celebrated his wife's birthday this weekend at [facility].  He continues to sleep well and is enjoying the cooler temperatures at night.  He does not have any major stressors at this point.    Expected Outcomes  Short: Continue to stay positive.  Long: Continue to deal with health.     Interventions  Encouraged to attend  Cardiac Rehabilitation for the exercise    Continue Psychosocial Services   Follow up required by staff       Vocational Rehabilitation: Provide vocational rehab assistance to qualifying candidates.   Vocational Rehab Evaluation & Intervention: Vocational Rehab - 06/10/18 1405      Initial Vocational Rehab Evaluation & Intervention   Assessment shows need for Vocational Rehabilitation  No       Education: Education Goals: Education classes will be provided on a variety of topics geared toward better understanding of heart health and risk factor modification. Participant will state understanding/return demonstration of topics presented as noted by education test scores.  Learning Barriers/Preferences: Learning Barriers/Preferences - 06/10/18 1404      Learning Barriers/Preferences   Learning Barriers  None    Learning Preferences  None       Education Topics:  AED/CPR: - Group verbal and written instruction with the use of models to demonstrate the basic use of the AED with the basic ABC's of resuscitation.   Cardiac Rehab from 08/10/2018 in [facility] Cardiac and Pulmonary Rehab  Date  07/08/18  Educator  MA  Instruction Review Code  1- Verbalizes Understanding      General Nutrition Guidelines/Fats and Fiber: -Group instruction provided by verbal, written material, models and posters to present the general guidelines for heart healthy nutrition. Gives an explanation and review of dietary fats and fiber.   Cardiac Rehab from 08/10/2018 in [facility] Cardiac and Pulmonary Rehab  Date  07/13/18  Educator  LB  Instruction Review Code  1- Verbalizes Understanding      Controlling Sodium/Reading Food Labels: -Group verbal and written material supporting the discussion of sodium use in heart healthy nutrition. Review and explanation with models, verbal and written materials for utilization of the food label.   Cardiac Rehab from 08/10/2018 in [facility] Cardiac and Pulmonary Rehab  Date   07/15/18  Educator  LB  Instruction Review Code  1- Verbalizes Understanding      Exercise Physiology & General Exercise Guidelines: - Group verbal and written instruction with models to review the exercise physiology of the cardiovascular system and associated critical values. Provides general exercise guidelines with specific guidelines to those with heart or lung disease.    Cardiac Rehab from 08/10/2018 in [facility] Cardiac and Pulmonary Rehab  Date  07/27/18  Educator  [facility]  Instruction Review Code  1- Verbalizes Understanding      Aerobic Exercise & Resistance Training: - Gives group verbal and written instruction on the various components of exercise. Focuses on aerobic and resistive training programs and the benefits of this training and how to safely progress through these programs..   Cardiac Rehab from 08/10/2018 in [facility] Cardiac and Pulmonary Rehab  Date  07/29/18  Educator  AS  Instruction Review Code  1- Verbalizes Understanding      Flexibility, Balance, Mind/Body Relaxation: Provides group verbal/written instruction on the benefits of flexibility and balance training, including mind/body exercise modes such as yoga, pilates and tai chi.  Demonstration and skill practice provided.   Cardiac Rehab from 08/10/2018 in [facility] Cardiac and Pulmonary Rehab  Date  08/03/18  Educator  AS  Instruction Review Code  1- Verbalizes Understanding      Stress and Anxiety: - Provides group verbal and written instruction about the health risks of elevated stress and causes of high stress.  Discuss the correlation between heart/lung disease and anxiety and treatment options. Review healthy ways to manage with stress and anxiety.   Cardiac Rehab from 08/10/2018 in [facility] Cardiac and Pulmonary Rehab  Date  06/22/18  Educator  [facility]  Instruction Review Code  1- Verbalizes Understanding      Depression: - Provides group verbal and written instruction on the correlation between heart/lung disease and  depressed mood, treatment options, and the stigmas associated with seeking treatment.   Anatomy & Physiology of the Heart: - Group verbal and written instruction and models provide basic cardiac anatomy and physiology, with the coronary electrical and arterial systems. Review of Valvular disease and Heart Failure   Cardiac Rehab from 08/10/2018 in [facility] Cardiac and Pulmonary Rehab  Date  06/24/18  Educator  CE  Instruction Review Code  1- Verbalizes Understanding      Cardiac Procedures: - Group verbal and written instruction to review commonly prescribed medications for heart disease. Reviews the medication, class of the drug, and side effects. Includes the steps to properly store meds and maintain the prescription regimen. (beta blockers and nitrates)   Cardiac Rehab from 08/10/2018 in [facility] Cardiac and Pulmonary Rehab  Date  07/06/18  Educator  KS  Instruction Review Code  1- Verbalizes Understanding      Cardiac Medications I: - Group verbal and written instruction to review commonly prescribed medications for heart disease. Reviews the medication, class of the drug, and side effects. Includes the steps to properly store meds and maintain the prescription regimen.   Cardiac Rehab from 08/10/2018 in [facility] Cardiac and Pulmonary Rehab  Date  06/29/18  Educator  SB  Instruction Review Code  1- Verbalizes Understanding      Cardiac Medications II: -Group verbal and written instruction to review commonly prescribed medications for heart disease. Reviews the medication, class of the drug, and side effects. (all other drug classes)   Cardiac Rehab from 08/10/2018 in [facility] Cardiac and Pulmonary Rehab  Date  08/10/18  Educator  SB  Instruction  Review Code  1- Verbalizes Understanding       Go Sex-Intimacy & Heart Disease, Get SMART - Goal Setting: - Group verbal and written instruction through game format to discuss heart disease and the return to sexual intimacy. Provides group verbal and  written material to discuss and apply goal setting through the application of the S.M.A.R.T. Method.   Cardiac Rehab from 08/10/2018 in [facility] Cardiac and Pulmonary Rehab  Date  07/06/18  Educator  KS  Instruction Review Code  1- Verbalizes Understanding      Other Matters of the Heart: - Provides group verbal, written materials and models to describe Stable Angina and Peripheral Artery. Includes description of the disease process and treatment options available to the cardiac patient.   Cardiac Rehab from 08/10/2018 in [facility] Cardiac and Pulmonary Rehab  Date  06/24/18  Educator  CE  Instruction Review Code  1- Verbalizes Understanding      Exercise & Equipment Safety: - Individual verbal instruction and demonstration of equipment use and safety with use of the equipment.   Cardiac Rehab from 08/10/2018 in [facility] Cardiac and Pulmonary Rehab  Date  06/10/18  Educator  [facility]  Instruction Review Code  1- Verbalizes Understanding      Infection Prevention: - Provides verbal and written material to individual with discussion of infection control including proper hand washing and proper equipment cleaning during exercise session.   Cardiac Rehab from 08/10/2018 in [facility] Cardiac and Pulmonary Rehab  Date  06/10/18  Educator  [facility]  Instruction Review Code  1- Verbalizes Understanding      Falls Prevention: - Provides verbal and written material to individual with discussion of falls prevention and safety.   Cardiac Rehab from 08/10/2018 in [facility] Cardiac and Pulmonary Rehab  Date  06/10/18  Educator  [facility]  Instruction Review Code  1- Verbalizes Understanding      Diabetes: - Individual verbal and written instruction to review signs/symptoms of diabetes, desired ranges of glucose level fasting, after meals and with exercise. Acknowledge that pre and post exercise glucose checks will be done for 3 sessions at entry of program.   Know Your Numbers and Risk Factors: -Group verbal and written  instruction about important numbers in your health.  Discussion of what are risk factors and how they play a role in the disease process.  Review of Cholesterol, Blood Pressure, Diabetes, and BMI and the role they play in your overall health.   Cardiac Rehab from 08/10/2018 in [facility] Cardiac and Pulmonary Rehab  Date  08/10/18  Educator  SB  Instruction Review Code  1- Verbalizes Understanding      Sleep Hygiene: -Provides group verbal and written instruction about how sleep can affect your health.  Define sleep hygiene, discuss sleep cycles and impact of sleep habits. Review good sleep hygiene tips.    Cardiac Rehab from 08/10/2018 in [facility] Cardiac and Pulmonary Rehab  Date  07/20/18  Educator  [facility]  Instruction Review Code  1- Verbalizes Understanding      Other: -Provides group and verbal instruction on various topics (see comments)   Knowledge Questionnaire Score: Knowledge Questionnaire Score - 06/10/18 1404      Knowledge Questionnaire Score   Pre Score  23/26   correct answers reviewed with with Anthony Shepherd. focus on exercise and Angina      Core Components/Risk Factors/Patient Goals at Admission: Personal Goals and Risk Factors at Admission - 06/10/18 1402      Core Components/Risk Factors/Patient Goals on Admission  Weight Management  Yes;Weight Loss    Intervention  Weight Management: Develop a combined nutrition and exercise program designed to reach desired caloric intake, while maintaining appropriate intake of nutrient and fiber, sodium and fats, and appropriate energy expenditure required for the weight goal.;Weight Management: Provide education and appropriate resources to help participant work on and attain dietary goals.;Weight Management/Obesity: Establish reasonable short term and long term weight goals.    Admit Weight  190 lb 8 oz (86.4 kg)    Goal Weight: Short Term  185 lb (83.9 kg)    Goal Weight: Long Term  180 lb (81.6 kg)    Expected Outcomes  Short Term:  Continue to assess and modify interventions until short term weight is achieved;Long Term: Adherence to nutrition and physical activity/exercise program aimed toward attainment of established weight goal;Weight Loss: Understanding of general recommendations for a balanced deficit meal plan, which promotes 1-2 lb weight loss per week and includes a negative energy balance of [phone] kcal/d;Understanding recommendations for meals to include 15-35% energy as protein, 25-35% energy from fat, 35-60% energy from carbohydrates, less than '200mg'$  of dietary cholesterol, 20-35 gm of total fiber daily;Understanding of distribution of calorie intake throughout the day with the consumption of 4-5 meals/snacks    Hypertension  Yes    Intervention  Provide education on lifestyle modifcations including regular physical activity/exercise, weight management, moderate sodium restriction and increased consumption of fresh fruit, vegetables, and low fat dairy, alcohol moderation, and smoking cessation.;Monitor prescription use compliance.    Expected Outcomes  Short Term: Continued assessment and intervention until BP is < 140/34m HG in hypertensive participants. < 130/810mHG in hypertensive participants with diabetes, heart failure or chronic kidney disease.;Long Term: Maintenance of blood pressure at goal levels.    Lipids  Yes    Intervention  Provide education and support for participant on nutrition & aerobic/resistive exercise along with prescribed medications to achieve LDL '70mg'$ , HDL >'40mg'$ .    Expected Outcomes  Short Term: Participant states understanding of desired cholesterol values and is compliant with medications prescribed. Participant is following exercise prescription and nutrition guidelines.;Long Term: Cholesterol controlled with medications as prescribed, with individualized exercise RX and with personalized nutrition plan. Value goals: LDL < '70mg'$ , HDL > 40 mg.       Core Components/Risk Factors/Patient  Goals Review:  Goals and Risk Factor Review    Row Name 07/20/18 0801             Core Components/Risk Factors/Patient Goals Review   Personal Goals Review  Hypertension;Weight Management/Obesity;Lipids       Review  MiRonalee Beltsas switched his blood pressure medication about a week ago.  He has switched from an ACE-I to an ARB because of a cough.  His cough is doing better, but his blood pressures have been a little low since then but are starting to even out.  He has not been checking his blood pressures at home as his cuff died.  He has decided that he will go get a new cuff this week to start watching it again.   His weight has been staying steady around 188-190 but has noticed that his belly is starting to go down.  He was able to fit into an older suit the other day.  Overall, he is doing well.   He is scheduled to meet with LoCecille Rubinn Monday and are planning to do some blood work then.  He is also scheduled for an echo at the end of December.  Expected Outcomes  Short: Get new blood pressure cough and get blood work done.  Long: Continue to work on weight loss.           Core Components/Risk Factors/Patient Goals at Discharge (Final Review):  Goals and Risk Factor Review - 07/20/18 0801      Core Components/Risk Factors/Patient Goals Review   Personal Goals Review  Hypertension;Weight Management/Obesity;Lipids    Review  Anthony Shepherd has switched his blood pressure medication about a week ago.  He has switched from an ACE-I to an ARB because of a cough.  His cough is doing better, but his blood pressures have been a little low since then but are starting to even out.  He has not been checking his blood pressures at home as his cuff died.  He has decided that he will go get a new cuff this week to start watching it again.   His weight has been staying steady around 188-190 but has noticed that his belly is starting to go down.  He was able to fit into an older suit the other day.  Overall, he is doing  well.   He is scheduled to meet with Cecille Rubin on Monday and are planning to do some blood work then.  He is also scheduled for an echo at the end of December.     Expected Outcomes  Short: Get new blood pressure cough and get blood work done.  Long: Continue to work on weight loss.        ITP Comments: ITP Comments    Row Name 06/10/18 1357 06/16/18 0618 06/16/18 0626 07/14/18 0551 08/11/18 0556   ITP Comments  Med Review completed. Initial ITP created. Diagnosis can be found in [facility] 9/22  30 Day review completed. Continue with ITP unless directed changes per Medical Director review.  30 Day review completed. Continue with ITP unless directed changes per Medical Director review.  30 day review. Continue with ITP unless direccted changes per Medical Director Chart Review.  30 day review. Continue with ITP unless direccted changes per Medical Director Chart Review.      Comments:

## 2018-08-12 ENCOUNTER — Encounter: Payer: Medicare Other | Admitting: *Deleted

## 2018-08-12 DIAGNOSIS — Z955 Presence of coronary angioplasty implant and graft: Secondary | ICD-10-CM

## 2018-08-12 DIAGNOSIS — I214 Non-ST elevation (NSTEMI) myocardial infarction: Secondary | ICD-10-CM | POA: Diagnosis not present

## 2018-08-12 NOTE — Progress Notes (Signed)
Daily Session Note  Patient Details  Name: Anthony Shepherd MRN: [id number] Date of Birth: 1945-12-16 Referring Provider:     Cardiac Rehab from 06/10/2018 in [facility] Cardiac and Pulmonary Rehab  Referring Provider  [place]      Encounter Date: 08/12/2018  Check In: Session Check In - 08/12/18 0800      Check-In   Supervising physician immediately available to respond to emergencies  See telemetry face sheet for immediately available ER MD    Location  [facility]-Cardiac & Pulmonary Rehab    Staff Present  Vida Rigger RN, BSN;Jeanna Durrell BS, Exercise Physiologist;Arjun Hard Luan Pulling, MA, RCEP, CCRP, Exercise Physiologist;Amanda Oletta Darter, BA, ACSM CEP, Exercise Physiologist    Medication changes reported      No    Fall or balance concerns reported     No    Warm-up and Cool-down  Performed as group-led instruction    Resistance Training Performed  Yes    VAD Patient?  No    PAD/SET Patient?  No      Pain Assessment   Currently in Pain?  No/denies          Social History   Tobacco Use  Smoking Status Never Smoker  Smokeless Tobacco Never Used    Goals Met:  Independence with exercise equipment Exercise tolerated well Personal goals reviewed No report of cardiac concerns or symptoms Strength training completed today  Goals Unmet:  Not Applicable  Comments: Pt able to follow exercise prescription today without complaint.  Will continue to monitor for progression.    Dr. Emily Filbert is Medical Director for [facility].

## 2018-08-17 VITALS — Ht 71.5 in | Wt 191.0 lb

## 2018-08-17 DIAGNOSIS — I214 Non-ST elevation (NSTEMI) myocardial infarction: Secondary | ICD-10-CM

## 2018-08-17 DIAGNOSIS — Z955 Presence of coronary angioplasty implant and graft: Secondary | ICD-10-CM

## 2018-08-17 NOTE — Progress Notes (Signed)
Daily Session Note  Patient Details  Name: Anthony Shepherd MRN: [id number] Date of Birth: October 14, 1945 Referring Provider:     Cardiac Rehab from 06/10/2018 in [facility] Cardiac and Pulmonary Rehab  Referring Provider  Anthony Shepherd      Encounter Date: 08/17/2018  Check In: Session Check In - 08/17/18 0845      Check-In   Supervising physician immediately available to respond to emergencies  See telemetry face sheet for immediately available ER MD    Location  [facility]-Cardiac & Pulmonary Rehab    Staff Present  Anthony Sam, MA, RCEP, CCRP, Exercise Physiologist;Anthony Shepherd BS, Exercise Physiologist;Anthony Bice, RN, BSN, CCRP    Medication changes reported      No    Fall or balance concerns reported     No    Warm-up and Cool-down  Performed as group-led instruction    Resistance Training Performed  Yes    VAD Patient?  No    PAD/SET Patient?  No      Pain Assessment   Currently in Pain?  No/denies          Social History   Tobacco Use  Smoking Status Never Smoker  Smokeless Tobacco Never Used    Goals Met:  Independence with exercise equipment Exercise tolerated well No report of cardiac concerns or symptoms Strength training completed today  Goals Unmet:  Not Applicable  Comments: Pt able to follow exercise prescription today without complaint.  Will continue to monitor for progression. Anthony Shepherd Name 06/10/18 1337 08/17/18 1233       6 Minute Walk   Phase  Initial  Discharge    Distance  1400 feet  1845 feet    Distance % Change  0 %  31.8 %    Distance Feet Change  0 ft  445 ft    Walk Time  6 minutes  6 minutes    # of Rest Breaks  0  0    MPH  2.65  3.49    METS  3.25  4.16    RPE  14  12    Perceived Dyspnea   0  -    VO2 Peak  11.38  14.56    Symptoms  No  No    Resting HR  59 bpm  93 bpm    Resting BP  112/68  128/64    Resting Oxygen Saturation   98 %  96 %    Exercise Oxygen Saturation  during 6 min walk  -  92 %    Max Ex. HR  107  bpm  121 bpm    Max Ex. BP  132/60  138/74    2 Minute Post BP  110/64  -        Dr. Emily Filbert is Medical Director for Anthony Shepherd.

## 2018-08-17 NOTE — Patient Instructions (Signed)
Discharge Patient Instructions  Patient Details  Name: Anthony Shepherd MRN: [id number] Date of Birth: 1946-03-08 Referring Provider:  Jerline Pain, MD   Number of Visits: 36/36  Reason for Discharge:  Patient reached a stable level of exercise. Patient independent in their exercise. Patient has met program and personal goals.  Smoking History:  Social History   Tobacco Use  Smoking Status Never Smoker  Smokeless Tobacco Never Used    Diagnosis:  NSTEMI (non-ST elevated myocardial infarction) ([place])  Status post coronary artery stent placement  Initial Exercise Prescription: Initial Exercise Prescription - 06/10/18 1300      Date of Initial Exercise RX and Referring Provider   Date  06/10/18    Referring Provider  Skains      Treadmill   MPH  2.6    Grade  0.5    Minutes  15    METs  3.17      Recumbant Bike   Level  4    RPM  60    Watts  34    Minutes  15    METs  3.25      NuStep   Level  3    SPM  80    Minutes  15    METs  3.25      Prescription Details   Frequency (times per week)  3    Duration  Progress to 30 minutes of continuous aerobic without signs/symptoms of physical distress      Intensity   THRR 40-80% of Max Heartrate  95-131    Ratings of Perceived Exertion  11-15    Perceived Dyspnea  0-4      Progression   Progression  Continue to progress workloads to maintain intensity without signs/symptoms of physical distress.      Resistance Training   Training Prescription  Yes    Weight  3 lb    Reps  10-15       Discharge Exercise Prescription (Final Exercise Prescription Changes): Exercise Prescription Changes - 08/02/18 1500      Response to Exercise   Blood Pressure (Admit)  108/70    Blood Pressure (Exercise)  136/78    Blood Pressure (Exit)  98/60    Heart Rate (Admit)  87 bpm    Heart Rate (Exercise)  125 bpm    Heart Rate (Exit)  95 bpm    Rating of Perceived Exertion (Exercise)  13    Symptoms  none    Duration   Continue with 30 min of aerobic exercise without signs/symptoms of physical distress.    Intensity  THRR unchanged      Progression   Progression  Continue to progress workloads to maintain intensity without signs/symptoms of physical distress.    Average METs  3.75      Resistance Training   Training Prescription  Yes    Weight  4 lbs    Reps  10-15      Interval Training   Interval Training  No      Treadmill   MPH  3    Grade  1    Minutes  15    METs  3.71      NuStep   Level  6    Minutes  15    METs  3.8      Home Exercise Plan   Plans to continue exercise at  Home (comment)   Recumbent Bike, walking, treadmill   Frequency  Add 3  additional days to program exercise sessions.    Initial Home Exercises Provided  07/01/18       Functional Capacity: 6 Minute Walk    Row Name 06/10/18 1337 08/17/18 1233       6 Minute Walk   Phase  Initial  Discharge    Distance  1400 feet  1845 feet    Distance % Change  0 %  31.8 %    Distance Feet Change  0 ft  445 ft    Walk Time  6 minutes  6 minutes    # of Rest Breaks  0  0    MPH  2.65  3.49    METS  3.25  4.16    RPE  14  12    Perceived Dyspnea   0  -    VO2 Peak  11.38  14.56    Symptoms  No  No    Resting HR  59 bpm  93 bpm    Resting BP  112/68  128/64    Resting Oxygen Saturation   98 %  96 %    Exercise Oxygen Saturation  during 6 min walk  -  92 %    Max Ex. HR  107 bpm  121 bpm    Max Ex. BP  132/60  138/74    2 Minute Post BP  110/64  -       Quality of Life: Quality of Life - 08/17/18 1237      Quality of Life   Select  Quality of Life      Quality of Life Scores   Health/Function Pre  29.6 %    Health/Function Post  29.2 %    Health/Function % Change  -1.35 %    Socioeconomic Pre  30 %    Socioeconomic Post  30 %    Socioeconomic % Change   0 %    Psych/Spiritual Pre  30 %    Psych/Spiritual Post  28.93 %    Psych/Spiritual % Change  -3.57 %    Family Pre  30 %    Family Post  30 %     Family % Change  0 %    GLOBAL Pre  29.82 %    GLOBAL Post  29.43 %    GLOBAL % Change  -1.31 %       Personal Goals: Goals established at orientation with interventions provided to work toward goal. Personal Goals and Risk Factors at Admission - 06/10/18 1402      Core Components/Risk Factors/Patient Goals on Admission    Weight Management  Yes;Weight Loss    Intervention  Weight Management: Develop a combined nutrition and exercise program designed to reach desired caloric intake, while maintaining appropriate intake of nutrient and fiber, sodium and fats, and appropriate energy expenditure required for the weight goal.;Weight Management: Provide education and appropriate resources to help participant work on and attain dietary goals.;Weight Management/Obesity: Establish reasonable short term and long term weight goals.    Admit Weight  190 lb 8 oz (86.4 kg)    Goal Weight: Short Term  185 lb (83.9 kg)    Goal Weight: Long Term  180 lb (81.6 kg)    Expected Outcomes  Short Term: Continue to assess and modify interventions until short term weight is achieved;Long Term: Adherence to nutrition and physical activity/exercise program aimed toward attainment of established weight goal;Weight Loss: Understanding of general recommendations for a balanced deficit meal plan,  which promotes 1-2 lb weight loss per week and includes a negative energy balance of [phone] kcal/d;Understanding recommendations for meals to include 15-35% energy as protein, 25-35% energy from fat, 35-60% energy from carbohydrates, less than 256m of dietary cholesterol, 20-35 gm of total fiber daily;Understanding of distribution of calorie intake throughout the day with the consumption of 4-5 meals/snacks    Hypertension  Yes    Intervention  Provide education on lifestyle modifcations including regular physical activity/exercise, weight management, moderate sodium restriction and increased consumption of fresh fruit,  vegetables, and low fat dairy, alcohol moderation, and smoking cessation.;Monitor prescription use compliance.    Expected Outcomes  Short Term: Continued assessment and intervention until BP is < 140/969mHG in hypertensive participants. < 130/8016mG in hypertensive participants with diabetes, heart failure or chronic kidney disease.;Long Term: Maintenance of blood pressure at goal levels.    Lipids  Yes    Intervention  Provide education and support for participant on nutrition & aerobic/resistive exercise along with prescribed medications to achieve LDL <52m31mDL >40mg44m Expected Outcomes  Short Term: Participant states understanding of desired cholesterol values and is compliant with medications prescribed. Participant is following exercise prescription and nutrition guidelines.;Long Term: Cholesterol controlled with medications as prescribed, with individualized exercise RX and with personalized nutrition plan. Value goals: LDL < 52mg,46m > 40 mg.        Personal Goals Discharge: Goals and Risk Factor Review - 08/12/18 0822      Core Components/Risk Factors/Patient Goals Review   Personal Goals Review  Hypertension;Weight Management/Obesity;Lipids    Review  Mike's weight has been staying steady around 190lbs.   He is getting new cuff for Christmas from his daughter!!  His blood work was good and everything was within normal limits.  He was quite exctied about these results.  He is waching his diet and exercising more to help with weight control.     Expected Outcomes  Short: Continue to work on weight loss.  Long: Continue to monitor risk factors.        Exercise Goals and Review: Exercise Goals    Row Name 06/10/18 1336             Exercise Goals   Increase Physical Activity  Yes       Intervention  Provide advice, education, support and counseling about physical activity/exercise needs.;Develop an individualized exercise prescription for aerobic and resistive training based  on initial evaluation findings, risk stratification, comorbidities and participant's personal goals.       Expected Outcomes  Short Term: Attend rehab on a regular basis to increase amount of physical activity.;Long Term: Add in home exercise to make exercise part of routine and to increase amount of physical activity.;Long Term: Exercising regularly at least 3-5 days a week.       Increase Strength and Stamina  Yes       Intervention  Provide advice, education, support and counseling about physical activity/exercise needs.;Develop an individualized exercise prescription for aerobic and resistive training based on initial evaluation findings, risk stratification, comorbidities and participant's personal goals.       Expected Outcomes  Short Term: Increase workloads from initial exercise prescription for resistance, speed, and METs.;Short Term: Perform resistance training exercises routinely during rehab and add in resistance training at home;Long Term: Improve cardiorespiratory fitness, muscular endurance and strength as measured by increased METs and functional capacity (6MWT)       Able to understand and use rate of perceived  exertion (RPE) scale  Yes       Intervention  Provide education and explanation on how to use RPE scale       Expected Outcomes  Short Term: Able to use RPE daily in rehab to express subjective intensity level;Long Term:  Able to use RPE to guide intensity level when exercising independently       Able to understand and use Dyspnea scale  Yes       Intervention  Provide education and explanation on how to use Dyspnea scale       Expected Outcomes  Short Term: Able to use Dyspnea scale daily in rehab to express subjective sense of shortness of breath during exertion;Long Term: Able to use Dyspnea scale to guide intensity level when exercising independently       Knowledge and understanding of Target Heart Rate Range (THRR)  Yes       Intervention  Provide education and explanation  of THRR including how the numbers were predicted and where they are located for reference       Expected Outcomes  Short Term: Able to state/look up THRR;Short Term: Able to use daily as guideline for intensity in rehab;Long Term: Able to use THRR to govern intensity when exercising independently       Able to check pulse independently  Yes       Intervention  Provide education and demonstration on how to check pulse in carotid and radial arteries.;Review the importance of being able to check your own pulse for safety during independent exercise       Expected Outcomes  Short Term: Able to explain why pulse checking is important during independent exercise;Long Term: Able to check pulse independently and accurately       Understanding of Exercise Prescription  Yes       Intervention  Provide education, explanation, and written materials on patient's individual exercise prescription       Expected Outcomes  Short Term: Able to explain program exercise prescription;Long Term: Able to explain home exercise prescription to exercise independently          Exercise Goals Re-Evaluation: Exercise Goals Re-Evaluation    Row Name 06/22/18 0949 07/01/18 0829 07/20/18 0757 08/02/18 1555 08/12/18 0817     Exercise Goal Re-Evaluation   Exercise Goals Review  Increase Physical Activity;Increase Strength and Stamina;Able to understand and use rate of perceived exertion (RPE) scale;Knowledge and understanding of Target Heart Rate Range (THRR);Understanding of Exercise Prescription  Increase Physical Activity;Able to understand and use rate of perceived exertion (RPE) scale;Knowledge and understanding of Target Heart Rate Range (THRR);Understanding of Exercise Prescription;Increase Strength and Stamina;Able to check pulse independently  Increase Physical Activity;Increase Strength and Stamina;Understanding of Exercise Prescription  Increase Physical Activity;Increase Strength and Stamina;Understanding of Exercise  Prescription  Increase Physical Activity;Increase Strength and Stamina;Understanding of Exercise Prescription   Comments  Reviewed RPE scale, THR and program prescription with pt today.  Pt voiced understanding and was given a copy of goals to take home.   Reviewed home exercise with pt today.  Pt plans to use recumbent bike, walk outisde, and on the treadmill for exercise.  Reviewed THR, pulse, RPE, sign and symptoms, NTG use, and when to call 911 or MD.  Also discussed weather considerations and indoor options.  Pt voiced understanding.  Ronalee Belts is doing well in rehab.  He is walking for a mile and using his recumbent bike on his off days from rehab.  He is feeling like his strength  and stamina are coming back and recovering.  He has also noticed that his SOB has improved as well.   Ronalee Belts continues to do well in rehab.  He is up to level 6 on the NuStep.  We will continue to monitor his progression.   Ronalee Belts is doing well in rehab.  He has been walking a mile a day and been riding his bike for 25 min.  He is working his weigh up to 30 min.  He is using level 2 to level 6 on it.  He is feeling much stronger and has more stamina.  He says he feels like he is in the best shape since he was in college.    Expected Outcomes  Short: Use RPE daily to regulate intensity. Long: Follow program prescription in THR.  Short: Start home exercise. Long: Continue to exercise independently.  Short: Continue to walk on off days.  Long: Continue to improve strength and stamina.   Short: Talk about adding in intervals.  Long: Continue to walk more on his off days from rehab.   Short: Work up to 30 min on bike.  Long: Continue to exercise independently      Nutrition & Weight - Outcomes: Pre Biometrics - 06/10/18 1335      Pre Biometrics   Height  5' 11.5" (1.816 m)    Weight  190 lb 8 oz (86.4 kg)    Waist Circumference  40.5 inches    Hip Circumference  41 inches    Waist to Hip Ratio  0.99 %    BMI (Calculated)  26.2     Single Leg Stand  16.5 seconds      Post Biometrics - 08/17/18 1234       Post  Biometrics   Height  5' 11.5" (1.816 m)    Weight  191 lb (86.6 kg)    Waist Circumference  36.75 inches    Hip Circumference  39 inches    Waist to Hip Ratio  0.94 %    BMI (Calculated)  26.27    Single Leg Stand  30 seconds       Nutrition: Nutrition Therapy & Goals - 06/24/18 1155      Nutrition Therapy   Diet  DASH    Drug/Food Interactions  Statins/Certain Fruits    Protein (specify units)  9-10oz    Fiber  35 grams    Whole Grain Foods  3 servings   eats white-wheat bread   Saturated Fats  16 max. grams    Fruits and Vegetables  6 servings/day   8 ideal; eats more vegetables than fruits- eats fruits every few days   Sodium  1500 grams      Personal Nutrition Goals   Nutrition Goal  Do not add salt to meals after it has been cooked. Eventually, work with your wife to use less salt during the cooking process    Personal Goal #2  When buying canned soups, look for varieties that are lower in sodium. Ideally with 122m or less per serving    Personal Goal #3  Eat at least one additional serving of fruit per week    Comments  He has been trying to limit salt and overall fat intake. Eats 2-3 meals/day depending on his work schedule. Breakfast options include oatmeal with granola, eggs / bacon/ grits once per week. He and his wife limit red meat and fried food intake. He does not add salt at the table but his  wife still cooks with salt.      Intervention Plan   Intervention  Prescribe, educate and counsel regarding individualized specific dietary modifications aiming towards targeted core components such as weight, hypertension, lipid management, diabetes, heart failure and other comorbidities.    Expected Outcomes  Short Term Goal: Understand basic principles of dietary content, such as calories, fat, sodium, cholesterol and nutrients.;Long Term Goal: Adherence to prescribed nutrition plan.;Short  Term Goal: A plan has been developed with personal nutrition goals set during dietitian appointment.       Nutrition Discharge: Nutrition Assessments - 08/17/18 1236      MEDFICTS Scores   Pre Score  49    Post Score  12    Score Difference  -37       Education Questionnaire Score: Knowledge Questionnaire Score - 08/17/18 1236      Knowledge Questionnaire Score   Pre Score  23/26    Post Score  24/26   test reviewed with pt today      Goals reviewed with patient; copy given to patient.

## 2018-08-19 ENCOUNTER — Encounter: Payer: Medicare Other | Admitting: *Deleted

## 2018-08-19 DIAGNOSIS — I214 Non-ST elevation (NSTEMI) myocardial infarction: Secondary | ICD-10-CM | POA: Diagnosis not present

## 2018-08-19 DIAGNOSIS — Z955 Presence of coronary angioplasty implant and graft: Secondary | ICD-10-CM

## 2018-08-19 NOTE — Progress Notes (Signed)
Discharge Progress Report  Patient Details  Name: Anthony Shepherd MRN: [id number] Date of Birth: 1946-06-28 Referring Provider:     Cardiac Rehab from 06/10/2018 in [facility] Cardiac and Pulmonary Rehab  Referring Provider  Skains       Number of Visits: 18  Reason for Discharge:  Patient reached a stable level of exercise. Patient independent in their exercise. Patient has met program and personal goals.  Smoking History:  Social History   Tobacco Use  Smoking Status Never Smoker  Smokeless Tobacco Never Used    Diagnosis:  NSTEMI (non-ST elevated myocardial infarction) ([place])  Status post coronary artery stent placement  ADL [facility]SD:   Initial Exercise Prescription: Initial Exercise Prescription - 06/10/18 1300      Date of Initial Exercise RX and Referring Provider   Date  06/10/18    Referring Provider  Skains      Treadmill   MPH  2.6    Grade  0.5    Minutes  15    METs  3.17      Recumbant Bike   Level  4    RPM  60    Watts  34    Minutes  15    METs  3.25      NuStep   Level  3    SPM  80    Minutes  15    METs  3.25      Prescription Details   Frequency (times per week)  3    Duration  Progress to 30 minutes of continuous aerobic without signs/symptoms of physical distress      Intensity   THRR 40-80% of Max Heartrate  95-131    Ratings of Perceived Exertion  11-15    Perceived Dyspnea  0-4      Progression   Progression  Continue to progress workloads to maintain intensity without signs/symptoms of physical distress.      Resistance Training   Training Prescription  Yes    Weight  3 lb    Reps  10-15       Discharge Exercise Prescription (Final Exercise Prescription Changes): Exercise Prescription Changes - 08/02/18 1500      Response to Exercise   Blood Pressure (Admit)  108/70    Blood Pressure (Exercise)  136/78    Blood Pressure (Exit)  98/60    Heart Rate (Admit)  87 bpm    Heart Rate (Exercise)  125 bpm    Heart Rate  (Exit)  95 bpm    Rating of Perceived Exertion (Exercise)  13    Symptoms  none    Duration  Continue with 30 min of aerobic exercise without signs/symptoms of physical distress.    Intensity  THRR unchanged      Progression   Progression  Continue to progress workloads to maintain intensity without signs/symptoms of physical distress.    Average METs  3.75      Resistance Training   Training Prescription  Yes    Weight  4 lbs    Reps  10-15      Interval Training   Interval Training  No      Treadmill   MPH  3    Grade  1    Minutes  15    METs  3.71      NuStep   Level  6    Minutes  15    METs  3.8      Home Exercise Plan  Plans to continue exercise at  Home (comment)   Recumbent Bike, walking, treadmill   Frequency  Add 3 additional days to program exercise sessions.    Initial Home Exercises Provided  07/01/18       Functional Capacity: 6 Minute Walk    Row Name 06/10/18 1337 08/17/18 1233       6 Minute Walk   Phase  Initial  Discharge    Distance  1400 feet  1845 feet    Distance % Change  0 %  31.8 %    Distance Feet Change  0 ft  445 ft    Walk Time  6 minutes  6 minutes    # of Rest Breaks  0  0    MPH  2.65  3.49    METS  3.25  4.16    RPE  14  12    Perceived Dyspnea   0  -    VO2 Peak  11.38  14.56    Symptoms  No  No    Resting HR  59 bpm  93 bpm    Resting BP  112/68  128/64    Resting Oxygen Saturation   98 %  96 %    Exercise Oxygen Saturation  during 6 min walk  -  92 %    Max Ex. HR  107 bpm  121 bpm    Max Ex. BP  132/60  138/74    2 Minute Post BP  110/64  -       Psychological, QOL, Others - Outcomes: PHQ 2/9: Depression screen [facility] 2/9 08/17/2018 06/10/2018  Decreased Interest 0 0  Down, Depressed, Hopeless 0 0  PHQ - 2 Score 0 0  Altered sleeping 0 0  Tired, decreased energy 1 0  Change in appetite 0 0  Feeling bad or failure about yourself  0 0  Trouble concentrating 0 0  Moving slowly or fidgety/restless 0 0   Suicidal thoughts 0 0  PHQ-9 Score 1 0    Quality of Life: Quality of Life - 08/17/18 1237      Quality of Life   Select  Quality of Life      Quality of Life Scores   Health/Function Pre  29.6 %    Health/Function Post  29.2 %    Health/Function % Change  -1.35 %    Socioeconomic Pre  30 %    Socioeconomic Post  30 %    Socioeconomic % Change   0 %    Psych/Spiritual Pre  30 %    Psych/Spiritual Post  28.93 %    Psych/Spiritual % Change  -3.57 %    Family Pre  30 %    Family Post  30 %    Family % Change  0 %    GLOBAL Pre  29.82 %    GLOBAL Post  29.43 %    GLOBAL % Change  -1.31 %       Personal Goals: Goals established at orientation with interventions provided to work toward goal. Personal Goals and Risk Factors at Admission - 06/10/18 1402      Core Components/Risk Factors/Patient Goals on Admission    Weight Management  Yes;Weight Loss    Intervention  Weight Management: Develop a combined nutrition and exercise program designed to reach desired caloric intake, while maintaining appropriate intake of nutrient and fiber, sodium and fats, and appropriate energy expenditure required for the weight goal.;Weight Management: Provide education and  appropriate resources to help participant work on and attain dietary goals.;Weight Management/Obesity: Establish reasonable short term and long term weight goals.    Admit Weight  190 lb 8 oz (86.4 kg)    Goal Weight: Short Term  185 lb (83.9 kg)    Goal Weight: Long Term  180 lb (81.6 kg)    Expected Outcomes  Short Term: Continue to assess and modify interventions until short term weight is achieved;Long Term: Adherence to nutrition and physical activity/exercise program aimed toward attainment of established weight goal;Weight Loss: Understanding of general recommendations for a balanced deficit meal plan, which promotes 1-2 lb weight loss per week and includes a negative energy balance of [phone] kcal/d;Understanding  recommendations for meals to include 15-35% energy as protein, 25-35% energy from fat, 35-60% energy from carbohydrates, less than 258m of dietary cholesterol, 20-35 gm of total fiber daily;Understanding of distribution of calorie intake throughout the day with the consumption of 4-5 meals/snacks    Hypertension  Yes    Intervention  Provide education on lifestyle modifcations including regular physical activity/exercise, weight management, moderate sodium restriction and increased consumption of fresh fruit, vegetables, and low fat dairy, alcohol moderation, and smoking cessation.;Monitor prescription use compliance.    Expected Outcomes  Short Term: Continued assessment and intervention until BP is < 140/940mHG in hypertensive participants. < 130/8029mG in hypertensive participants with diabetes, heart failure or chronic kidney disease.;Long Term: Maintenance of blood pressure at goal levels.    Lipids  Yes    Intervention  Provide education and support for participant on nutrition & aerobic/resistive exercise along with prescribed medications to achieve LDL <29m69mDL >40mg17m Expected Outcomes  Short Term: Participant states understanding of desired cholesterol values and is compliant with medications prescribed. Participant is following exercise prescription and nutrition guidelines.;Long Term: Cholesterol controlled with medications as prescribed, with individualized exercise RX and with personalized nutrition plan. Value goals: LDL < 29mg,83m > 40 mg.        Personal Goals Discharge: Goals and Risk Factor Review    Row Name 07/20/18 0801 08/12/18 0822  3244     Core Components/Risk Factors/Patient Goals Review   Personal Goals Review  Hypertension;Weight Management/Obesity;Lipids  Hypertension;Weight Management/Obesity;Lipids      Review  Anthony Shepherd hRonalee Beltswitched his blood pressure medication about a week ago.  He has switched from an ACE-I to an ARB because of a cough.  His cough is doing  better, but his blood pressures have been a little low since then but are starting to even out.  He has not been checking his blood pressures at home as his cuff died.  He has decided that he will go get a new cuff this week to start watching it again.   His weight has been staying steady around 188-190 but has noticed that his belly is starting to go down.  He was able to fit into an older suit the other day.  Overall, he is doing well.   He is scheduled to meet with Lori oCecille Rubinnday and are planning to do some blood work then.  He is also scheduled for an echo at the end of December.   Anthony Shepherd's weight has been staying steady around 190lbs.   He is getting new cuff for Christmas from his daughter!!  His blood work was good and everything was within normal limits.  He was quite exctied about these results.  He is waching his diet and exercising  more to help with weight control.       Expected Outcomes  Short: Get new blood pressure cough and get blood work done.  Long: Continue to work on weight loss.   Short: Continue to work on weight loss.  Long: Continue to monitor risk factors.          Exercise Goals and Review: Exercise Goals    Row Name 06/10/18 1336             Exercise Goals   Increase Physical Activity  Yes       Intervention  Provide advice, education, support and counseling about physical activity/exercise needs.;Develop an individualized exercise prescription for aerobic and resistive training based on initial evaluation findings, risk stratification, comorbidities and participant's personal goals.       Expected Outcomes  Short Term: Attend rehab on a regular basis to increase amount of physical activity.;Long Term: Add in home exercise to make exercise part of routine and to increase amount of physical activity.;Long Term: Exercising regularly at least 3-5 days a week.       Increase Strength and Stamina  Yes       Intervention  Provide advice, education, support and counseling about  physical activity/exercise needs.;Develop an individualized exercise prescription for aerobic and resistive training based on initial evaluation findings, risk stratification, comorbidities and participant's personal goals.       Expected Outcomes  Short Term: Increase workloads from initial exercise prescription for resistance, speed, and METs.;Short Term: Perform resistance training exercises routinely during rehab and add in resistance training at home;Long Term: Improve cardiorespiratory fitness, muscular endurance and strength as measured by increased METs and functional capacity (6MWT)       Able to understand and use rate of perceived exertion (RPE) scale  Yes       Intervention  Provide education and explanation on how to use RPE scale       Expected Outcomes  Short Term: Able to use RPE daily in rehab to express subjective intensity level;Long Term:  Able to use RPE to guide intensity level when exercising independently       Able to understand and use Dyspnea scale  Yes       Intervention  Provide education and explanation on how to use Dyspnea scale       Expected Outcomes  Short Term: Able to use Dyspnea scale daily in rehab to express subjective sense of shortness of breath during exertion;Long Term: Able to use Dyspnea scale to guide intensity level when exercising independently       Knowledge and understanding of Target Heart Rate Range (THRR)  Yes       Intervention  Provide education and explanation of THRR including how the numbers were predicted and where they are located for reference       Expected Outcomes  Short Term: Able to state/look up THRR;Short Term: Able to use daily as guideline for intensity in rehab;Long Term: Able to use THRR to govern intensity when exercising independently       Able to check pulse independently  Yes       Intervention  Provide education and demonstration on how to check pulse in carotid and radial arteries.;Review the importance of being able to  check your own pulse for safety during independent exercise       Expected Outcomes  Short Term: Able to explain why pulse checking is important during independent exercise;Long Term: Able to check pulse independently and accurately  Understanding of Exercise Prescription  Yes       Intervention  Provide education, explanation, and written materials on patient's individual exercise prescription       Expected Outcomes  Short Term: Able to explain program exercise prescription;Long Term: Able to explain home exercise prescription to exercise independently          Exercise Goals Re-Evaluation: Exercise Goals Re-Evaluation    Row Name 06/22/18 0949 07/01/18 0829 07/20/18 0757 08/02/18 1555 08/12/18 0817     Exercise Goal Re-Evaluation   Exercise Goals Review  Increase Physical Activity;Increase Strength and Stamina;Able to understand and use rate of perceived exertion (RPE) scale;Knowledge and understanding of Target Heart Rate Range (THRR);Understanding of Exercise Prescription  Increase Physical Activity;Able to understand and use rate of perceived exertion (RPE) scale;Knowledge and understanding of Target Heart Rate Range (THRR);Understanding of Exercise Prescription;Increase Strength and Stamina;Able to check pulse independently  Increase Physical Activity;Increase Strength and Stamina;Understanding of Exercise Prescription  Increase Physical Activity;Increase Strength and Stamina;Understanding of Exercise Prescription  Increase Physical Activity;Increase Strength and Stamina;Understanding of Exercise Prescription   Comments  Reviewed RPE scale, THR and program prescription with pt today.  Pt voiced understanding and was given a copy of goals to take home.   Reviewed home exercise with pt today.  Pt plans to use recumbent bike, walk outisde, and on the treadmill for exercise.  Reviewed THR, pulse, RPE, sign and symptoms, NTG use, and when to call 911 or MD.  Also discussed weather considerations  and indoor options.  Pt voiced understanding.  Ronalee Belts is doing well in rehab.  He is walking for a mile and using his recumbent bike on his off days from rehab.  He is feeling like his strength and stamina are coming back and recovering.  He has also noticed that his SOB has improved as well.   Ronalee Belts continues to do well in rehab.  He is up to level 6 on the NuStep.  We will continue to monitor his progression.   Ronalee Belts is doing well in rehab.  He has been walking a mile a day and been riding his bike for 25 min.  He is working his weigh up to 30 min.  He is using level 2 to level 6 on it.  He is feeling much stronger and has more stamina.  He says he feels like he is in the best shape since he was in college.    Expected Outcomes  Short: Use RPE daily to regulate intensity. Long: Follow program prescription in THR.  Short: Start home exercise. Long: Continue to exercise independently.  Short: Continue to walk on off days.  Long: Continue to improve strength and stamina.   Short: Talk about adding in intervals.  Long: Continue to walk more on his off days from rehab.   Short: Work up to 30 min on bike.  Long: Continue to exercise independently      Nutrition & Weight - Outcomes: Pre Biometrics - 06/10/18 1335      Pre Biometrics   Height  5' 11.5" (1.816 m)    Weight  190 lb 8 oz (86.4 kg)    Waist Circumference  40.5 inches    Hip Circumference  41 inches    Waist to Hip Ratio  0.99 %    BMI (Calculated)  26.2    Single Leg Stand  16.5 seconds      Post Biometrics - 08/17/18 [place]  Height  5' 11.5" (1.816 m)    Weight  191 lb (86.6 kg)    Waist Circumference  36.75 inches    Hip Circumference  39 inches    Waist to Hip Ratio  0.94 %    BMI (Calculated)  26.27    Single Leg Stand  30 seconds       Nutrition: Nutrition Therapy & Goals - 06/24/18 1155      Nutrition Therapy   Diet  DASH    Drug/Food Interactions  Statins/Certain Fruits    Protein (specify units)   9-10oz    Fiber  35 grams    Whole Grain Foods  3 servings   eats white-wheat bread   Saturated Fats  16 max. grams    Fruits and Vegetables  6 servings/day   8 ideal; eats more vegetables than fruits- eats fruits every few days   Sodium  1500 grams      Personal Nutrition Goals   Nutrition Goal  Do not add salt to meals after it has been cooked. Eventually, work with your wife to use less salt during the cooking process    Personal Goal #2  When buying canned soups, look for varieties that are lower in sodium. Ideally with 125m or less per serving    Personal Goal #3  Eat at least one additional serving of fruit per week    Comments  He has been trying to limit salt and overall fat intake. Eats 2-3 meals/day depending on his work schedule. Breakfast options include oatmeal with granola, eggs / bacon/ grits once per week. He and his wife limit red meat and fried food intake. He does not add salt at the table but his wife still cooks with salt.      Intervention Plan   Intervention  Prescribe, educate and counsel regarding individualized specific dietary modifications aiming towards targeted core components such as weight, hypertension, lipid management, diabetes, heart failure and other comorbidities.    Expected Outcomes  Short Term Goal: Understand basic principles of dietary content, such as calories, fat, sodium, cholesterol and nutrients.;Long Term Goal: Adherence to prescribed nutrition plan.;Short Term Goal: A plan has been developed with personal nutrition goals set during dietitian appointment.       Nutrition Discharge: Nutrition Assessments - 08/17/18 1236      MEDFICTS Scores   Pre Score  49    Post Score  12    Score Difference  -37       Education Questionnaire Score: Knowledge Questionnaire Score - 08/17/18 1236      Knowledge Questionnaire Score   Pre Score  23/26    Post Score  24/26   test reviewed with pt today      Goals reviewed with patient; copy given  to patient.

## 2018-08-19 NOTE — Progress Notes (Signed)
Daily Session Note  Patient Details  Name: Anthony Shepherd MRN: [id number] Date of Birth: April 30, 1946 Referring Provider:     Cardiac Rehab from 06/10/2018 in [facility] Cardiac and Pulmonary Rehab  Referring Provider  Skains      Encounter Date: 08/19/2018  Check In: Session Check In - 08/19/18 0741      Check-In   Supervising physician immediately available to respond to emergencies  See telemetry face sheet for immediately available ER MD    Location  [facility]-Cardiac & Pulmonary Rehab    Staff Present  Jasper Loser BS, Exercise Physiologist;Carroll Enterkin, RN, BSN;Jessica [place], MA, RCEP, CCRP, Exercise Physiologist;Amanda Oletta Darter, BA, ACSM CEP, Exercise Physiologist    Medication changes reported      No    Fall or balance concerns reported     No    Warm-up and Cool-down  Performed as group-led instruction    Resistance Training Performed  Yes    VAD Patient?  No    PAD/SET Patient?  No      Pain Assessment   Currently in Pain?  No/denies          Social History   Tobacco Use  Smoking Status Never Smoker  Smokeless Tobacco Never Used    Goals Met:  Independence with exercise equipment Exercise tolerated well Personal goals reviewed No report of cardiac concerns or symptoms Strength training completed today  Goals Unmet:  Not Applicable  Comments:  Jshon graduated today from  rehab with 36 sessions completed.  Details of the patient's exercise prescription and what He needs to do in order to continue the prescription and progress were discussed with patient.  Patient was given a copy of prescription and goals.  Patient verbalized understanding.  Myrick plans to continue to exercise by continuing to ride his bike at home.    Dr. Emily Filbert is Medical Director for [facility].

## 2018-08-19 NOTE — Progress Notes (Signed)
Cardiac Individual Treatment Plan  Patient Details  Name: BRAZEN DOMANGUE MRN: [id number] Date of Birth: 06-13-1946 Referring Provider:     Cardiac Rehab from 06/10/2018 in [facility] Cardiac and Pulmonary Rehab  Referring Provider  Skains      Initial Encounter Date:    Cardiac Rehab from 06/10/2018 in [facility] Cardiac and Pulmonary Rehab  Date  06/10/18      Visit Diagnosis: NSTEMI (non-ST elevated myocardial infarction) [facility])  Status post coronary artery stent placement  Patient's Home Medications on Admission:  Current Outpatient Medications:  .  apixaban (ELIQUIS) 5 MG TABS tablet, Take 1 tablet (5 mg total) by mouth 2 (two) times daily., Disp: 60 tablet, Rfl: 3 .  atorvastatin (LIPITOR) 80 MG tablet, Take 1 tablet (80 mg total) by mouth daily at 6 PM., Disp: 90 tablet, Rfl: 3 .  bimatoprost (LUMIGAN) 0.01 % SOLN, Place 1 drop into both eyes at bedtime., Disp: , Rfl:  .  clopidogrel (PLAVIX) 75 MG tablet, Take 1 tablet (75 mg total) by mouth daily., Disp: 30 tablet, Rfl: 6 .  Coenzyme Q10 (CO Q 10 PO), Take 1 tablet by mouth daily., Disp: , Rfl:  .  Cyanocobalamin (VITAMIN B-12 PO), Take 1 tablet by mouth daily., Disp: , Rfl:  .  hyoscyamine (LEVSIN SL) 0.125 MG SL tablet, Place 0.125 mg under the tongue daily as needed for cramping. , Disp: , Rfl:  .  losartan (COZAAR) 50 MG tablet, Take 1 tablet (50 mg total) by mouth daily., Disp: 90 tablet, Rfl: 3 .  metoprolol succinate (TOPROL-XL) 25 MG 24 hr tablet, Take 1 tablet (25 mg total) by mouth daily., Disp: 30 tablet, Rfl: 3 .  Multiple Vitamin (MULTIVITAMIN WITH MINERALS) TABS tablet, Take 1 tablet by mouth daily., Disp: , Rfl:  .  naphazoline-pheniramine (NAPHCON-A) 0.025-0.3 % ophthalmic solution, Place 1 drop into both eyes 5 (five) times daily as needed for eye irritation (dry eyes/seasonal allergies)., Disp: , Rfl:  .  nitroGLYCERIN (NITROSTAT) 0.4 MG SL tablet, Place 1 tablet (0.4 mg total) under the tongue every 5 (five) minutes x 3  doses as needed for chest pain., Disp: 25 tablet, Rfl: 3 .  vitamin C (ASCORBIC ACID) 500 MG tablet, Take 500 mg by mouth daily., Disp: , Rfl:  .  vitamin E 400 UNIT capsule, Take 400 Units by mouth daily., Disp: , Rfl:   Past Medical History: Past Medical History:  Diagnosis Date  . HLD (hyperlipidemia)   . Ischemic cardiomyopathy   . Ischemic heart disease     Tobacco Use: Social History   Tobacco Use  Smoking Status Never Smoker  Smokeless Tobacco Never Used    Labs: Recent Review Scientist, physiological    Labs for ITP Cardiac and Pulmonary Rehab Latest Ref Rng & Units 05/31/2018 07/26/2018   Cholestrol 100 - 199 mg/dL 129 85(L)   LDLCALC 0 - 99 mg/dL 68 36   HDL >39 mg/dL 30(L) 33(L)   Trlycerides 0 - 149 mg/dL 156(H) 79       Exercise Target Goals: Exercise Program Goal: Individual exercise prescription set using results from initial 6 min walk test and THRR while considering  patient's activity barriers and safety.   Exercise Prescription Goal: Initial exercise prescription builds to 30-45 minutes a day of aerobic activity, 2-3 days per week.  Home exercise guidelines will be given to patient during program as part of exercise prescription that the participant will acknowledge.  Activity Barriers & Risk Stratification:   6 Minute  Walk: 6 Minute Walk    Row Name 06/10/18 1337 08/17/18 1233       6 Minute Walk   Phase  Initial  Discharge    Distance  1400 feet  1845 feet    Distance % Change  0 %  31.8 %    Distance Feet Change  0 ft  445 ft    Walk Time  6 minutes  6 minutes    # of Rest Breaks  0  0    MPH  2.65  3.49    METS  3.25  4.16    RPE  14  12    Perceived Dyspnea   0  -    VO2 Peak  11.38  14.56    Symptoms  No  No    Resting HR  59 bpm  93 bpm    Resting BP  112/68  128/64    Resting Oxygen Saturation   98 %  96 %    Exercise Oxygen Saturation  during 6 min walk  -  92 %    Max Ex. HR  107 bpm  121 bpm    Max Ex. BP  132/60  138/74    2 Minute  Post BP  110/64  -       Oxygen Initial Assessment:   Oxygen Re-Evaluation:   Oxygen Discharge (Final Oxygen Re-Evaluation):   Initial Exercise Prescription: Initial Exercise Prescription - 06/10/18 1300      Date of Initial Exercise RX and Referring Provider   Date  06/10/18    Referring Provider  Skains      Treadmill   MPH  2.6    Grade  0.5    Minutes  15    METs  3.17      Recumbant Bike   Level  4    RPM  60    Watts  34    Minutes  15    METs  3.25      NuStep   Level  3    SPM  80    Minutes  15    METs  3.25      Prescription Details   Frequency (times per week)  3    Duration  Progress to 30 minutes of continuous aerobic without signs/symptoms of physical distress      Intensity   THRR 40-80% of Max Heartrate  95-131    Ratings of Perceived Exertion  11-15    Perceived Dyspnea  0-4      Progression   Progression  Continue to progress workloads to maintain intensity without signs/symptoms of physical distress.      Resistance Training   Training Prescription  Yes    Weight  3 lb    Reps  10-15       Perform Capillary Blood Glucose checks as needed.  Exercise Prescription Changes: Exercise Prescription Changes    Row Name 06/10/18 1300 07/01/18 0800 07/05/18 1300 07/20/18 1300 08/02/18 1500     Response to Exercise   Blood Pressure (Admit)  112/68  -  132/70  122/74  108/70   Blood Pressure (Exercise)  132/60  -  136/70  110/76  136/78   Blood Pressure (Exit)  110/64  -  126/70  134/60  98/60   Heart Rate (Admit)  59 bpm  -  92 bpm  100 bpm  87 bpm   Heart Rate (Exercise)  107 bpm  -  119 bpm  137 bpm  125 bpm   Heart Rate (Exit)  60 bpm  -  91 bpm  106 bpm  95 bpm   Oxygen Saturation (Admit)  98 %  -  -  -  -   Rating of Perceived Exertion (Exercise)  14  -  _0 Perceived Dyspnea (Exercise)  0  -  -  -  -   Symptoms  none  -  none  none  none   Duration  -  -  Continue with 30 min of aerobic exercise without signs/symptoms  of physical distress.  Continue with 30 min of aerobic exercise without signs/symptoms of physical distress.  Continue with 30 min of aerobic exercise without signs/symptoms of physical distress.   Intensity  -  -  THRR unchanged  THRR unchanged  THRR unchanged     Progression   Progression  -  -  Continue to progress workloads to maintain intensity without signs/symptoms of physical distress.  Continue to progress workloads to maintain intensity without signs/symptoms of physical distress.  Continue to progress workloads to maintain intensity without signs/symptoms of physical distress.   Average METs  -  -  3.53  3.86  3.75     Resistance Training   Training Prescription  -  -  Yes  Yes  Yes   Weight  -  -  4 lbs  4 lbs  4 lbs   Reps  -  -  10-15  10-15  10-15     Interval Training   Interval Training  -  -  No  No  No     Treadmill   MPH  -  -  2._1 Grade  -  -  _2 Minutes  -  -  _3 METs  -  -  3.26  3.71  3.71     Recumbant Bike   Level  -  -  4  4  -   RPM  -  -  50  -  -   Minutes  -  -  15  15  -     NuStep   Level  -  -  _4 Minutes  -  -  _5 METs  -  -  3.8  4  3.8     Home Exercise Plan   Plans to continue exercise at  -  Home (comment) Recumbent Bike, walking, treadmill  Home (comment) Recumbent Bike, walking, treadmill  Home (comment) Recumbent Bike, walking, treadmill  Home (comment) Recumbent Bike, walking, treadmill   Frequency  -  Add 3 additional days to program exercise sessions.  Add 3 additional days to program exercise sessions.  Add 3 additional days to program exercise sessions.  Add 3 additional days to program exercise sessions.   Initial Home Exercises Provided  -  07/01/18  07/01/18  07/01/18  07/01/18      Exercise Comments: Exercise Comments    Row Name 06/22/18 0949 08/19/18 0743         Exercise Comments  First full day of exercise!  Patient was oriented to gym and equipment including functions,  settings, policies, and procedures.  Patient's individual exercise prescription and treatment plan were reviewed.  All starting workloads were established based on the results of the  6 minute walk test done at initial orientation visit.  The plan for exercise progression was also introduced and progression will be customized based on patient's performance and goals.   Isamar graduated today from  rehab with 36 sessions completed.  Details of the patient's exercise prescription and what He needs to do in order to continue the prescription and progress were discussed with patient.  Patient was given a copy of prescription and goals.  Patient verbalized understanding.  Kaimana plans to continue to exercise by continuing to ride his bike at home.         Exercise Goals and Review: Exercise Goals    Row Name 06/10/18 1336             Exercise Goals   Increase Physical Activity  Yes       Intervention  Provide advice, education, support and counseling about physical activity/exercise needs.;Develop an individualized exercise prescription for aerobic and resistive training based on initial evaluation findings, risk stratification, comorbidities and participant's personal goals.       Expected Outcomes  Short Term: Attend rehab on a regular basis to increase amount of physical activity.;Long Term: Add in home exercise to make exercise part of routine and to increase amount of physical activity.;Long Term: Exercising regularly at least 3-5 days a week.       Increase Strength and Stamina  Yes       Intervention  Provide advice, education, support and counseling about physical activity/exercise needs.;Develop an individualized exercise prescription for aerobic and resistive training based on initial evaluation findings, risk stratification, comorbidities and participant's personal goals.       Expected Outcomes  Short Term: Increase workloads from initial exercise prescription for resistance, speed, and  METs.;Short Term: Perform resistance training exercises routinely during rehab and add in resistance training at home;Long Term: Improve cardiorespiratory fitness, muscular endurance and strength as measured by increased METs and functional capacity (6MWT)       Able to understand and use rate of perceived exertion (RPE) scale  Yes       Intervention  Provide education and explanation on how to use RPE scale       Expected Outcomes  Short Term: Able to use RPE daily in rehab to express subjective intensity level;Long Term:  Able to use RPE to guide intensity level when exercising independently       Able to understand and use Dyspnea scale  Yes       Intervention  Provide education and explanation on how to use Dyspnea scale       Expected Outcomes  Short Term: Able to use Dyspnea scale daily in rehab to express subjective sense of shortness of breath during exertion;Long Term: Able to use Dyspnea scale to guide intensity level when exercising independently       Knowledge and understanding of Target Heart Rate Range (THRR)  Yes       Intervention  Provide education and explanation of THRR including how the numbers were predicted and where they are located for reference       Expected Outcomes  Short Term: Able to state/look up THRR;Short Term: Able to use daily as guideline for intensity in rehab;Long Term: Able to use THRR to govern intensity when exercising independently       Able to check pulse independently  Yes       Intervention  Provide education and demonstration on how to check pulse in carotid and radial arteries.;Review the importance of  being able to check your own pulse for safety during independent exercise       Expected Outcomes  Short Term: Able to explain why pulse checking is important during independent exercise;Long Term: Able to check pulse independently and accurately       Understanding of Exercise Prescription  Yes       Intervention  Provide education, explanation, and  written materials on patient's individual exercise prescription       Expected Outcomes  Short Term: Able to explain program exercise prescription;Long Term: Able to explain home exercise prescription to exercise independently          Exercise Goals Re-Evaluation : Exercise Goals Re-Evaluation    Row Name 06/22/18 0949 07/01/18 0829 07/20/18 0757 08/02/18 1555 08/12/18 0817     Exercise Goal Re-Evaluation   Exercise Goals Review  Increase Physical Activity;Increase Strength and Stamina;Able to understand and use rate of perceived exertion (RPE) scale;Knowledge and understanding of Target Heart Rate Range (THRR);Understanding of Exercise Prescription  Increase Physical Activity;Able to understand and use rate of perceived exertion (RPE) scale;Knowledge and understanding of Target Heart Rate Range (THRR);Understanding of Exercise Prescription;Increase Strength and Stamina;Able to check pulse independently  Increase Physical Activity;Increase Strength and Stamina;Understanding of Exercise Prescription  Increase Physical Activity;Increase Strength and Stamina;Understanding of Exercise Prescription  Increase Physical Activity;Increase Strength and Stamina;Understanding of Exercise Prescription   Comments  Reviewed RPE scale, THR and program prescription with pt today.  Pt voiced understanding and was given a copy of goals to take home.   Reviewed home exercise with pt today.  Pt plans to use recumbent bike, walk outisde, and on the treadmill for exercise.  Reviewed THR, pulse, RPE, sign and symptoms, NTG use, and when to call 911 or MD.  Also discussed weather considerations and indoor options.  Pt voiced understanding.  Ronalee Belts is doing well in rehab.  He is walking for a mile and using his recumbent bike on his off days from rehab.  He is feeling like his strength and stamina are coming back and recovering.  He has also noticed that his SOB has improved as well.   Ronalee Belts continues to do well in rehab.  He is up  to level 6 on the NuStep.  We will continue to monitor his progression.   Ronalee Belts is doing well in rehab.  He has been walking a mile a day and been riding his bike for 25 min.  He is working his weigh up to 30 min.  He is using level 2 to level 6 on it.  He is feeling much stronger and has more stamina.  He says he feels like he is in the best shape since he was in college.    Expected Outcomes  Short: Use RPE daily to regulate intensity. Long: Follow program prescription in THR.  Short: Start home exercise. Long: Continue to exercise independently.  Short: Continue to walk on off days.  Long: Continue to improve strength and stamina.   Short: Talk about adding in intervals.  Long: Continue to walk more on his off days from rehab.   Short: Work up to 30 min on bike.  Long: Continue to exercise independently      Discharge Exercise Prescription (Final Exercise Prescription Changes): Exercise Prescription Changes - 08/02/18 1500      Response to Exercise   Blood Pressure (Admit)  108/70    Blood Pressure (Exercise)  136/78    Blood Pressure (Exit)  98/60    Heart  Rate (Admit)  87 bpm    Heart Rate (Exercise)  125 bpm    Heart Rate (Exit)  95 bpm    Rating of Perceived Exertion (Exercise)  13    Symptoms  none    Duration  Continue with 30 min of aerobic exercise without signs/symptoms of physical distress.    Intensity  THRR unchanged      Progression   Progression  Continue to progress workloads to maintain intensity without signs/symptoms of physical distress.    Average METs  3.75      Resistance Training   Training Prescription  Yes    Weight  4 lbs    Reps  10-15      Interval Training   Interval Training  No      Treadmill   MPH  3    Grade  1    Minutes  15    METs  3.71      NuStep   Level  6    Minutes  15    METs  3.8      Home Exercise Plan   Plans to continue exercise at  Home (comment)   Recumbent Bike, walking, treadmill   Frequency  Add 3 additional days to  program exercise sessions.    Initial Home Exercises Provided  07/01/18       Nutrition:  Target Goals: Understanding of nutrition guidelines, daily intake of sodium <1556m, cholesterol <2046m calories 30% from fat and 7% or less from saturated fats, daily to have 5 or more servings of fruits and vegetables.  Biometrics: Pre Biometrics - 06/10/18 1335      Pre Biometrics   Height  5' 11.5" (1.816 m)    Weight  190 lb 8 oz (86.4 kg)    Waist Circumference  40.5 inches    Hip Circumference  41 inches    Waist to Hip Ratio  0.99 %    BMI (Calculated)  26.2    Single Leg Stand  16.5 seconds      Post Biometrics - 08/17/18 1234       Post  Biometrics   Height  5' 11.5" (1.816 m)    Weight  191 lb (86.6 kg)    Waist Circumference  36.75 inches    Hip Circumference  39 inches    Waist to Hip Ratio  0.94 %    BMI (Calculated)  26.27    Single Leg Stand  30 seconds       Nutrition Therapy Plan and Nutrition Goals: Nutrition Therapy & Goals - 06/24/18 1155      Nutrition Therapy   Diet  DASH    Drug/Food Interactions  Statins/Certain Fruits    Protein (specify units)  9-10oz    Fiber  35 grams    Whole Grain Foods  3 servings   eats white-wheat bread   Saturated Fats  16 max. grams    Fruits and Vegetables  6 servings/day   8 ideal; eats more vegetables than fruits- eats fruits every few days   Sodium  1500 grams      Personal Nutrition Goals   Nutrition Goal  Do not add salt to meals after it has been cooked. Eventually, work with your wife to use less salt during the cooking process    Personal Goal #2  When buying canned soups, look for varieties that are lower in sodium. Ideally with 14035mr less per serving    Personal Goal #3  Eat at least one additional serving of fruit per week    Comments  He has been trying to limit salt and overall fat intake. Eats 2-3 meals/day depending on his work schedule. Breakfast options include oatmeal with granola, eggs / bacon/ grits  once per week. He and his wife limit red meat and fried food intake. He does not add salt at the table but his wife still cooks with salt.      Intervention Plan   Intervention  Prescribe, educate and counsel regarding individualized specific dietary modifications aiming towards targeted core components such as weight, hypertension, lipid management, diabetes, heart failure and other comorbidities.    Expected Outcomes  Short Term Goal: Understand basic principles of dietary content, such as calories, fat, sodium, cholesterol and nutrients.;Long Term Goal: Adherence to prescribed nutrition plan.;Short Term Goal: A plan has been developed with personal nutrition goals set during dietitian appointment.       Nutrition Assessments: Nutrition Assessments - 08/17/18 1236      MEDFICTS Scores   Pre Score  49    Post Score  12    Score Difference  -37       Nutrition Goals Re-Evaluation: Nutrition Goals Re-Evaluation    Row Name 06/24/18 1232 07/20/18 0852 08/19/18 0837         Goals   Nutrition Goal  When buying canned soups, look for varieties that are lower in sodium. Ideally with 161m or less per serving  Do not add salt to meals after they have been cooked; look for lower sodium canned soups; eat at least one additional serving of fruit per week  Do not add salt to meals after they have been cooked; look for lower sodium canned soup; eat at least one additional serving of fruit per week     Comment  He has been buying canned soups but has not checked the sodium content on the label  He and his wife have been trying to cook with less salt. Last night for supper they had roasted vegetables with garlic and olive oil + rotisserie chicken. He has not been eating canned soups as often, but plans to buy lower sodium varietites when he is in the mood for soup again. They have not been purchasing red meats recently. Has been having yogurt daily with or without fruit  He has continued to be mindful  about the amount of salt he is using both during cooking and at the table. He is having difficulty finding low sodium canned soup but is trying to buy "lighter" versions. He has been eating fruits like oranges recently     Expected Outcome  He will choose canned items/soups with 1476mof sodium or less per serving  He will continue to work with his wife to cook with less salt, choose lean meats, and include vegetables regularly. He will continue to add fruits more regularly and not add salt to meals after cooking. He and his wife will look for low sodium options when shopping IE canned soups  He will continue to look for ways to reduce sodium in his diet. Long term goal: eat multiple servings of fruit daily       Personal Goal #2 Re-Evaluation   Personal Goal #2  Do not add salt to meals after it has been cooked. Eventually work with your wife to use less salt during the cooking process  -  -       Personal Goal #3 Re-Evaluation   Personal  Goal #3  Eat at least one additional serving of fruit per week  -  -        Nutrition Goals Discharge (Final Nutrition Goals Re-Evaluation): Nutrition Goals Re-Evaluation - 08/19/18 0837      Goals   Nutrition Goal  Do not add salt to meals after they have been cooked; look for lower sodium canned soup; eat at least one additional serving of fruit per week    Comment  He has continued to be mindful about the amount of salt he is using both during cooking and at the table. He is having difficulty finding low sodium canned soup but is trying to buy "lighter" versions. He has been eating fruits like oranges recently    Expected Outcome  He will continue to look for ways to reduce sodium in his diet. Long term goal: eat multiple servings of fruit daily       Psychosocial: Target Goals: Acknowledge presence or absence of significant depression and/or stress, maximize coping skills, provide positive support system. Participant is able to verbalize types and ability  to use techniques and skills needed for reducing stress and depression.   Initial Review & Psychosocial Screening: Initial Psych Review & Screening - 06/10/18 1406      Initial Review   Current issues with  None Identified   didn't report any issues during Med Review. Lives a socially active lifestyle and is happy to work on his health to become independent with exercise and heart healthy eating     [place]?  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      Screening Interventions   Interventions  Encouraged to exercise;Program counselor consult;To provide support and resources with identified psychosocial needs;Provide feedback about the scores to participant    Expected Outcomes  Short Term goal: Utilizing psychosocial counselor, staff and physician to assist with identification of specific Stressors or current issues interfering with healing process. Setting desired goal for each stressor or current issue identified.;Long Term Goal: Stressors or current issues are controlled or eliminated.;Short Term goal: Identification and review with participant of any Quality of Life or Depression concerns found by scoring the questionnaire.;Long Term goal: The participant improves quality of Life and PHQ9 Scores as seen by post scores and/or verbalization of changes       Quality of Life Scores:  Quality of Life - 08/17/18 1237      Quality of Life   Select  Quality of Life      Quality of Life Scores   Health/Function Pre  29.6 %    Health/Function Post  29.2 %    Health/Function % Change  -1.35 %    Socioeconomic Pre  30 %    Socioeconomic Post  30 %    Socioeconomic % Change   0 %    Psych/Spiritual Pre  30 %    Psych/Spiritual Post  28.93 %    Psych/Spiritual % Change  -3.57 %     Family Pre  30 %    Family Post  30 %    Family % Change  0 %    GLOBAL Pre  29.82 %    GLOBAL Post  29.43 %    GLOBAL % Change  -1.31 %  Scores of 19 and below usually indicate a poorer quality of life in these areas.  A difference of  2-3 points is a clinically meaningful difference.  A difference of 2-3 points in the total score of the Quality of Life Index has been associated with significant improvement in overall quality of life, self-image, physical symptoms, and general health in studies assessing change in quality of life.  PHQ-9: Recent Review Flowsheet Data    Depression screen [facility] 2/9 08/17/2018 06/10/2018   Decreased Interest 0 0   Down, Depressed, Hopeless 0 0   PHQ - 2 Score 0 0   Altered sleeping 0 0   Tired, decreased energy 1 0   Change in appetite 0 0   Feeling bad or failure about yourself  0 0   Trouble concentrating 0 0   Moving slowly or fidgety/restless 0 0   Suicidal thoughts 0 0   PHQ-9 Score 1 0     Interpretation of Total Score  Total Score Depression Severity:  1-4 = Minimal depression, 5-9 = Mild depression, 10-14 = Moderate depression, 15-19 = Moderately severe depression, 20-27 = Severe depression   Psychosocial Evaluation and Intervention: Psychosocial Evaluation - 07/06/18 1011      Psychosocial Evaluation & Interventions   Interventions  Encouraged to exercise with the program and follow exercise prescription    Comments  Counselor met with Mr. Fontan Ronalee Belts) today for initial psychosocial evaluation.  He is a 72 year old who had a heart attack recently and a stent inserted.  He reports being in good health other than his cardiac condition and being diagnosed with glaucoma approx. 6 months ago and is being treated with medications which are being effective.  He has a strong support system with a spouse of 39 years; a daughter and sister-in law locally and active involvement in his local church.  Ronalee Belts reports sleeping well and having a good  appetite.  He denies a history of depression or anxiety and is typically in a positive mood.  Ronalee Belts has minimal stress in his life at this time; although he works part-time at [employer].  Ronalee Belts has goals to "get healthy" and develop an exercise routine.  He will be followed by staff.    Expected Outcomes  Short:  Ronalee Belts will exercise consistently to improve his health overall.   Long:  Ronalee Belts will develop an exercise routine and maintain this for his health and mental health.    Continue Psychosocial Services   Follow up required by staff       Psychosocial Re-Evaluation: Psychosocial Re-Evaluation    Row Name 07/20/18 0759 08/12/18 0820           Psychosocial Re-Evaluation   Current issues with  Current Stress Concerns  None Identified      Comments  Ronalee Belts is doing well in rehab. He is doing well mentally.  He has a great support system in his wife. They celebrated his wife's birthday this weekend at [facility].  He continues to sleep well and is enjoying the cooler temperatures at night.  He does not have any major stressors at this point.  Ronalee Belts continues to do well.  He feels that he is in the best shape since college.  He has enjoyed meeting his classmates and staff for the fun atmosphere.  He continues to sleep well.       Expected Outcomes  Short: Continue to stay positive.  Long: Continue to deal with health.  Short: Continue to enjoy exercising and practicing self care.  Long: Continue to stay postive.       Interventions  Encouraged to attend Cardiac Rehabilitation for the exercise  Encouraged to attend Cardiac Rehabilitation for the exercise      Continue Psychosocial Services   Follow up required by staff  Follow up required by staff         Psychosocial Discharge (Final Psychosocial Re-Evaluation): Psychosocial Re-Evaluation - 08/12/18 0820      Psychosocial Re-Evaluation   Current issues with  None Identified    Comments  Ronalee Belts continues to do well.  He feels that he is in the  best shape since college.  He has enjoyed meeting his classmates and staff for the fun atmosphere.  He continues to sleep well.     Expected Outcomes  Short: Continue to enjoy exercising and practicing self care.  Long: Continue to stay postive.     Interventions  Encouraged to attend Cardiac Rehabilitation for the exercise    Continue Psychosocial Services   Follow up required by staff       Vocational Rehabilitation: Provide vocational rehab assistance to qualifying candidates.   Vocational Rehab Evaluation & Intervention: Vocational Rehab - 06/10/18 1405      Initial Vocational Rehab Evaluation & Intervention   Assessment shows need for Vocational Rehabilitation  No       Education: Education Goals: Education classes will be provided on a variety of topics geared toward better understanding of heart health and risk factor modification. Participant will state understanding/return demonstration of topics presented as noted by education test scores.  Learning Barriers/Preferences: Learning Barriers/Preferences - 06/10/18 1404      Learning Barriers/Preferences   Learning Barriers  None    Learning Preferences  None       Education Topics:  AED/CPR: - Group verbal and written instruction with the use of models to demonstrate the basic use of the AED with the basic ABC's of resuscitation.   Cardiac Rehab from 08/19/2018 in [facility] Cardiac and Pulmonary Rehab  Date  07/08/18  Educator  MA  Instruction Review Code  1- Verbalizes Understanding      General Nutrition Guidelines/Fats and Fiber: -Group instruction provided by verbal, written material, models and posters to present the general guidelines for heart healthy nutrition. Gives an explanation and review of dietary fats and fiber.   Cardiac Rehab from 08/19/2018 in [facility] Cardiac and Pulmonary Rehab  Date  07/13/18  Educator  LB  Instruction Review Code  1- Verbalizes Understanding      Controlling Sodium/Reading Food  Labels: -Group verbal and written material supporting the discussion of sodium use in heart healthy nutrition. Review and explanation with models, verbal and written materials for utilization of the food label.   Cardiac Rehab from 08/19/2018 in [facility] Cardiac and Pulmonary Rehab  Date  07/15/18  Educator  LB  Instruction Review Code  1- Verbalizes Understanding      Exercise Physiology & General Exercise Guidelines: - Group verbal and written instruction with models to review the exercise physiology of the cardiovascular system and associated critical values. Provides general exercise guidelines with specific guidelines to those with heart or lung disease.    Cardiac Rehab from 08/19/2018 in [facility] Cardiac and Pulmonary Rehab  Date  07/27/18  Educator  [facility]  Instruction Review Code  1- Verbalizes Understanding      Aerobic Exercise & Resistance Training: - Gives group verbal and written instruction on the various components of  exercise. Focuses on aerobic and resistive training programs and the benefits of this training and how to safely progress through these programs..   Cardiac Rehab from 08/19/2018 in [facility] Cardiac and Pulmonary Rehab  Date  07/29/18  Educator  AS  Instruction Review Code  1- Verbalizes Understanding      Flexibility, Balance, Mind/Body Relaxation: Provides group verbal/written instruction on the benefits of flexibility and balance training, including mind/body exercise modes such as yoga, pilates and tai chi.  Demonstration and skill practice provided.   Cardiac Rehab from 08/19/2018 in [facility] Cardiac and Pulmonary Rehab  Date  08/03/18  Educator  AS  Instruction Review Code  1- Verbalizes Understanding      Stress and Anxiety: - Provides group verbal and written instruction about the health risks of elevated stress and causes of high stress.  Discuss the correlation between heart/lung disease and anxiety and treatment options. Review healthy ways to manage with  stress and anxiety.   Cardiac Rehab from 08/19/2018 in [facility] Cardiac and Pulmonary Rehab  Date  06/22/18  Educator  [facility]  Instruction Review Code  1- Verbalizes Understanding      Depression: - Provides group verbal and written instruction on the correlation between heart/lung disease and depressed mood, treatment options, and the stigmas associated with seeking treatment.   Cardiac Rehab from 08/19/2018 in [facility] Cardiac and Pulmonary Rehab  Date  08/12/18  Educator  [facility]  Instruction Review Code  1- Verbalizes Understanding      Anatomy & Physiology of the Heart: - Group verbal and written instruction and models provide basic cardiac anatomy and physiology, with the coronary electrical and arterial systems. Review of Valvular disease and Heart Failure   Cardiac Rehab from 08/19/2018 in [facility] Cardiac and Pulmonary Rehab  Date  08/19/18  Educator  CE  Instruction Review Code  1- Verbalizes Understanding      Cardiac Procedures: - Group verbal and written instruction to review commonly prescribed medications for heart disease. Reviews the medication, class of the drug, and side effects. Includes the steps to properly store meds and maintain the prescription regimen. (beta blockers and nitrates)   Cardiac Rehab from 08/19/2018 in [facility] Cardiac and Pulmonary Rehab  Date  08/17/18  Educator  SB  Instruction Review Code  1- Verbalizes Understanding      Cardiac Medications I: - Group verbal and written instruction to review commonly prescribed medications for heart disease. Reviews the medication, class of the drug, and side effects. Includes the steps to properly store meds and maintain the prescription regimen.   Cardiac Rehab from 08/19/2018 in [facility] Cardiac and Pulmonary Rehab  Date  06/29/18  Educator  SB  Instruction Review Code  1- Verbalizes Understanding      Cardiac Medications II: -Group verbal and written instruction to review commonly prescribed medications for heart  disease. Reviews the medication, class of the drug, and side effects. (all other drug classes)   Cardiac Rehab from 08/19/2018 in [facility] Cardiac and Pulmonary Rehab  Date  08/10/18  Educator  SB  Instruction Review Code  1- Verbalizes Understanding       Go Sex-Intimacy & Heart Disease, Get SMART - Goal Setting: - Group verbal and written instruction through game format to discuss heart disease and the return to sexual intimacy. Provides group verbal and written material to discuss and apply goal setting through the application of the S.M.A.R.T. Method.   Cardiac Rehab from 08/19/2018 in [facility] Cardiac and Pulmonary Rehab  Date  08/17/18  Educator  SB  Instruction Review Code  1- Verbalizes Understanding      Other Matters of the Heart: - Provides group verbal, written materials and models to describe Stable Angina and Peripheral Artery. Includes description of the disease process and treatment options available to the cardiac patient.   Cardiac Rehab from 08/19/2018 in [facility] Cardiac and Pulmonary Rehab  Date  08/19/18  Educator  CE  Instruction Review Code  1- Verbalizes Understanding      Exercise & Equipment Safety: - Individual verbal instruction and demonstration of equipment use and safety with use of the equipment.   Cardiac Rehab from 08/19/2018 in [facility] Cardiac and Pulmonary Rehab  Date  06/10/18  Educator  [facility]  Instruction Review Code  1- Verbalizes Understanding      Infection Prevention: - Provides verbal and written material to individual with discussion of infection control including proper hand washing and proper equipment cleaning during exercise session.   Cardiac Rehab from 08/19/2018 in [facility] Cardiac and Pulmonary Rehab  Date  06/10/18  Educator  [facility]  Instruction Review Code  1- Verbalizes Understanding      Falls Prevention: - Provides verbal and written material to individual with discussion of falls prevention and safety.   Cardiac Rehab from 08/19/2018 in  [facility] Cardiac and Pulmonary Rehab  Date  06/10/18  Educator  [facility]  Instruction Review Code  1- Verbalizes Understanding      Diabetes: - Individual verbal and written instruction to review signs/symptoms of diabetes, desired ranges of glucose level fasting, after meals and with exercise. Acknowledge that pre and post exercise glucose checks will be done for 3 sessions at entry of program.   Know Your Numbers and Risk Factors: -Group verbal and written instruction about important numbers in your health.  Discussion of what are risk factors and how they play a role in the disease process.  Review of Cholesterol, Blood Pressure, Diabetes, and BMI and the role they play in your overall health.   Cardiac Rehab from 08/19/2018 in [facility] Cardiac and Pulmonary Rehab  Date  08/10/18  Educator  SB  Instruction Review Code  1- Verbalizes Understanding      Sleep Hygiene: -Provides group verbal and written instruction about how sleep can affect your health.  Define sleep hygiene, discuss sleep cycles and impact of sleep habits. Review good sleep hygiene tips.    Cardiac Rehab from 08/19/2018 in [facility] Cardiac and Pulmonary Rehab  Date  07/20/18  Educator  [facility]  Instruction Review Code  1- Verbalizes Understanding      Other: -Provides group and verbal instruction on various topics (see comments)   Knowledge Questionnaire Score: Knowledge Questionnaire Score - 08/17/18 1236      Knowledge Questionnaire Score   Pre Score  23/26    Post Score  24/26   test reviewed with pt today      Core Components/Risk Factors/Patient Goals at Admission: Personal Goals and Risk Factors at Admission - 06/10/18 1402      Core Components/Risk Factors/Patient Goals on Admission    Weight Management  Yes;Weight Loss    Intervention  Weight Management: Develop a combined nutrition and exercise program designed to reach desired caloric intake, while maintaining appropriate intake of nutrient and fiber, sodium and  fats, and appropriate energy expenditure required for the weight goal.;Weight Management: Provide education and appropriate resources to help participant work on and attain dietary goals.;Weight Management/Obesity: Establish reasonable short term and long term weight goals.    Admit  Weight  190 lb 8 oz (86.4 kg)    Goal Weight: Short Term  185 lb (83.9 kg)    Goal Weight: Long Term  180 lb (81.6 kg)    Expected Outcomes  Short Term: Continue to assess and modify interventions until short term weight is achieved;Long Term: Adherence to nutrition and physical activity/exercise program aimed toward attainment of established weight goal;Weight Loss: Understanding of general recommendations for a balanced deficit meal plan, which promotes 1-2 lb weight loss per week and includes a negative energy balance of [phone] kcal/d;Understanding recommendations for meals to include 15-35% energy as protein, 25-35% energy from fat, 35-60% energy from carbohydrates, less than 235m of dietary cholesterol, 20-35 gm of total fiber daily;Understanding of distribution of calorie intake throughout the day with the consumption of 4-5 meals/snacks    Hypertension  Yes    Intervention  Provide education on lifestyle modifcations including regular physical activity/exercise, weight management, moderate sodium restriction and increased consumption of fresh fruit, vegetables, and low fat dairy, alcohol moderation, and smoking cessation.;Monitor prescription use compliance.    Expected Outcomes  Short Term: Continued assessment and intervention until BP is < 140/918mHG in hypertensive participants. < 130/8061mG in hypertensive participants with diabetes, heart failure or chronic kidney disease.;Long Term: Maintenance of blood pressure at goal levels.    Lipids  Yes    Intervention  Provide education and support for participant on nutrition & aerobic/resistive exercise along with prescribed medications to achieve LDL <67m45mDL  >40mg60m Expected Outcomes  Short Term: Participant states understanding of desired cholesterol values and is compliant with medications prescribed. Participant is following exercise prescription and nutrition guidelines.;Long Term: Cholesterol controlled with medications as prescribed, with individualized exercise RX and with personalized nutrition plan. Value goals: LDL < 67mg,24m > 40 mg.       Core Components/Risk Factors/Patient Goals Review:  Goals and Risk Factor Review    Row Name 07/20/18 0801 08/12/18 0822  [id number]     Core Components/Risk Factors/Patient Goals Review   Personal Goals Review  Hypertension;Weight Management/Obesity;Lipids  Hypertension;Weight Management/Obesity;Lipids      Review  Mike hRonalee Beltswitched his blood pressure medication about a week ago.  He has switched from an ACE-I to an ARB because of a cough.  His cough is doing better, but his blood pressures have been a little low since then but are starting to even out.  He has not been checking his blood pressures at home as his cuff died.  He has decided that he will go get a new cuff this week to start watching it again.   His weight has been staying steady around 188-190 but has noticed that his belly is starting to go down.  He was able to fit into an older suit the other day.  Overall, he is doing well.   He is scheduled to meet with Lori oCecille Rubinnday and are planning to do some blood work then.  He is also scheduled for an echo at the end of December.   Mike's weight has been staying steady around 190lbs.   He is getting new cuff for Christmas from his daughter!!  His blood work was good and everything was within normal limits.  He was quite exctied about these results.  He is waching his diet and exercising more to help with weight control.       Expected Outcomes  Short: Get new blood pressure cough and get blood  work done.  Long: Continue to work on weight loss.   Short: Continue to work on weight loss.  Long: Continue  to monitor risk factors.          Core Components/Risk Factors/Patient Goals at Discharge (Final Review):  Goals and Risk Factor Review - 08/12/18 4235      Core Components/Risk Factors/Patient Goals Review   Personal Goals Review  Hypertension;Weight Management/Obesity;Lipids    Review  Mike's weight has been staying steady around 190lbs.   He is getting new cuff for Christmas from his daughter!!  His blood work was good and everything was within normal limits.  He was quite exctied about these results.  He is waching his diet and exercising more to help with weight control.     Expected Outcomes  Short: Continue to work on weight loss.  Long: Continue to monitor risk factors.        ITP Comments: ITP Comments    Row Name 06/10/18 1357 06/16/18 0618 06/16/18 0626 07/14/18 0551 08/11/18 0556   ITP Comments  Med Review completed. Initial ITP created. Diagnosis can be found in [facility] 9/22  30 Day review completed. Continue with ITP unless directed changes per Medical Director review.  30 Day review completed. Continue with ITP unless directed changes per Medical Director review.  30 day review. Continue with ITP unless direccted changes per Medical Director Chart Review.  30 day review. Continue with ITP unless direccted changes per Medical Director Chart Review.   Mildred Name 08/19/18 0746           ITP Comments  Discharge ITP sent and signed by Dr. Sabra Heck.  Discharge Summary routed to PCP and cardiologist.          Comments: Discharge ITP

## 2018-09-03 ENCOUNTER — Telehealth: Payer: Self-pay | Admitting: Cardiology

## 2018-09-03 NOTE — Telephone Encounter (Signed)
New Message:    Patient has some concern about some medication named Losartan. Patient states that it is time to get a refilled, but he is not sure if he needs to get 25 mg or 50 mg. He taking 2 25 mg a day. Please call patient.

## 2018-09-03 NOTE — Telephone Encounter (Signed)
Spoke with patient who has finished his 25 mg tablets of Losartan and is in need of the 50 mg tablets now.  RX for Losartan 50 mg (1) PO Daily [facility]18 for #90 X 3 refills.  Pt is aware he should contact the pharmacy to have this RX filled.  He reports his BP has mainly been 100-120 systolic, rarely it has been in the 90's however he has not had any dizziness or blurred vision.  He will keep his appts for echo and f/u with Dr Anne FuSkains as scheduled.  He will call back if further questions or concerns.  He expressed gratitude for the call back and information.

## 2018-09-06 ENCOUNTER — Ambulatory Visit (HOSPITAL_COMMUNITY): Payer: Medicare Other | Attending: Cardiology

## 2018-09-06 ENCOUNTER — Other Ambulatory Visit: Payer: Medicare Other

## 2018-09-06 ENCOUNTER — Other Ambulatory Visit: Payer: Self-pay

## 2018-09-06 DIAGNOSIS — I240 Acute coronary thrombosis not resulting in myocardial infarction: Secondary | ICD-10-CM | POA: Insufficient documentation

## 2018-09-06 MED ORDER — PERFLUTREN LIPID MICROSPHERE
1.0000 mL | INTRAVENOUS | Status: AC | PRN
  Administered 2018-09-06: 1 mL via INTRAVENOUS

## 2018-09-13 ENCOUNTER — Ambulatory Visit: Payer: Medicare Other | Admitting: Cardiology

## 2018-09-13 ENCOUNTER — Other Ambulatory Visit: Payer: Self-pay | Admitting: Nurse Practitioner

## 2018-09-13 ENCOUNTER — Encounter: Payer: Self-pay | Admitting: Cardiology

## 2018-09-13 VITALS — BP 110/66 | HR 73 | Ht 71.5 in | Wt 190.8 lb

## 2018-09-13 DIAGNOSIS — I2510 Atherosclerotic heart disease of native coronary artery without angina pectoris: Secondary | ICD-10-CM | POA: Diagnosis not present

## 2018-09-13 DIAGNOSIS — E7849 Other hyperlipidemia: Secondary | ICD-10-CM

## 2018-09-13 DIAGNOSIS — I255 Ischemic cardiomyopathy: Secondary | ICD-10-CM

## 2018-09-13 DIAGNOSIS — I240 Acute coronary thrombosis not resulting in myocardial infarction: Secondary | ICD-10-CM | POA: Diagnosis not present

## 2018-09-13 DIAGNOSIS — I2583 Coronary atherosclerosis due to lipid rich plaque: Secondary | ICD-10-CM

## 2018-09-13 LAB — BASIC METABOLIC PANEL
BUN/Creatinine Ratio: 13 (ref 10–24)
BUN: 13 mg/dL (ref 8–27)
CO2: 23 mmol/L (ref 20–29)
Calcium: 8.1 mg/dL — ABNORMAL LOW (ref 8.6–10.2)
Chloride: 102 mmol/L (ref 96–106)
Creatinine, Ser: 1 mg/dL (ref 0.76–1.27)
GFR calc Af Amer: 87 mL/min/{1.73_m2} (ref >59)
GFR calc non Af Amer: 75 mL/min/{1.73_m2} (ref >59)
Glucose: 99 mg/dL (ref 65–99)
Potassium: 4 mmol/L (ref 3.5–5.2)
Sodium: 139 mmol/L (ref 134–144)

## 2018-09-13 MED ORDER — ASPIRIN EC 81 MG PO TBEC: 81.0000 mg | DELAYED_RELEASE_TABLET | Freq: Every day | ORAL | 3 refills | Status: AC

## 2018-09-13 NOTE — Patient Instructions (Signed)
Medication Instructions:  You may discontinue your Eliquis.  Start Aspirin 81 mg a day. You may discontinue it after 05/2019. Continue all other medications as listed.  If you need a refill on your cardiac medications before your next appointment, please call your pharmacy.    Follow-Up: At [facility], you and your health needs are our priority.  As part of our continuing mission to provide you with exceptional heart care, we have created designated Provider Care Teams.  These Care Teams include your primary Cardiologist (physician) and Advanced Practice Providers (APPs -  Physician Assistants and Nurse Practitioners) who all work together to provide you with the care you need, when you need it. You will need a follow up appointment in 12 months.  Please call our office 2 months in advance to schedule this appointment.  You may see Donato Schultz, MD or one of the following Advanced Practice Providers on your designated Care Team:   Norma Fredrickson, NP Nada Boozer, NP . Georgie Chard, NP  Thank you for choosing [facility]!!

## 2018-09-13 NOTE — Progress Notes (Signed)
Cardiology Office Note:    Date:  09/13/2018   ID:  Anthony Shepherd, DOB [birth year], MRN [id number]  PCP:  Kirby Funk, MD  Cardiologist:  Donato Schultz, MD  Electrophysiologist:  None   Referring MD: Kirby Funk, MD     History of Present Illness:    Anthony Shepherd is a 73 y.o. male here for follow-up of coronary artery disease.  LV thrombus previously noted when EF was 35%.  Was on Plavix and Eliquis at the same time.  Aspirin was stopped after 1 month therapy.  No ACE inhibitor was started at the time because of blood pressure issue.  Fish oil was previously stopped because of anticoagulation.  Previously had lots of concerns cough, chest pain.  ARB was started.  Aspirin was stopped.  Overall he is doing very well.  His repeat echocardiogram showed improvement in his EF to 50%.  His apical thrombus had resolved.  We decided to stop his Eliquis.  Resumed aspirin.  In September, it will be 1 year postop placement.  Likely will drop the aspirin at that time.  Plavix monotherapy after that.  Feels well, he is walking 2 to 3 miles a day.  No difficulties, no chest pain fevers chills nausea vomiting syncope.   Past Medical History:  Diagnosis Date  . HLD (hyperlipidemia)   . Ischemic cardiomyopathy   . Ischemic heart disease     Past Surgical History:  Procedure Laterality Date  . CORONARY STENT INTERVENTION N/A 05/31/2018   Procedure: CORONARY STENT INTERVENTION;  Surgeon: Lyn Records, MD;  Location: [facility] INVASIVE CV LAB;  Service: Cardiovascular;  Laterality: N/A;  . LEFT HEART CATH AND CORONARY ANGIOGRAPHY N/A 05/31/2018   Procedure: LEFT HEART CATH AND CORONARY ANGIOGRAPHY;  Surgeon: Lyn Records, MD;  Location: [facility] INVASIVE CV LAB;  Service: Cardiovascular;  Laterality: N/A;  . ULTRASOUND GUIDANCE FOR VASCULAR ACCESS  05/31/2018   Procedure: Ultrasound Guidance For Vascular Access;  Surgeon: Lyn Records, MD;  Location: [facility] INVASIVE CV LAB;  Service: Cardiovascular;;     Current Medications: Current Meds  Medication Sig  . atorvastatin (LIPITOR) 80 MG tablet Take 1 tablet (80 mg total) by mouth daily at 6 PM.  . bimatoprost (LUMIGAN) 0.01 % SOLN Place 1 drop into both eyes at bedtime.  . clopidogrel (PLAVIX) 75 MG tablet Take 1 tablet (75 mg total) by mouth daily.  . Coenzyme Q10 (CO Q 10 PO) Take 200 mg by mouth daily.   . Cyanocobalamin (VITAMIN B-12 PO) Take 1 tablet by mouth daily.  . hyoscyamine (LEVSIN SL) 0.125 MG SL tablet Place 0.125 mg under the tongue daily as needed for cramping.   Marland Kitchen losartan (COZAAR) 50 MG tablet Take 1 tablet (50 mg total) by mouth daily.  . metoprolol succinate (TOPROL-XL) 25 MG 24 hr tablet Take 1 tablet (25 mg total) by mouth daily.  . Multiple Vitamin (MULTIVITAMIN WITH MINERALS) TABS tablet Take 1 tablet by mouth daily.  . naphazoline-pheniramine (NAPHCON-A) 0.025-0.3 % ophthalmic solution Place 1 drop into both eyes 5 (five) times daily as needed for eye irritation (dry eyes/seasonal allergies).  . nitroGLYCERIN (NITROSTAT) 0.4 MG SL tablet Place 1 tablet (0.4 mg total) under the tongue every 5 (five) minutes x 3 doses as needed for chest pain.  . vitamin C (ASCORBIC ACID) 500 MG tablet Take 500 mg by mouth daily.  . vitamin E 400 UNIT capsule Take 400 Units by mouth daily.  . [DISCONTINUED] apixaban (ELIQUIS) 5 MG  TABS tablet Take 1 tablet (5 mg total) by mouth 2 (two) times daily.     Allergies:   Ace inhibitors   Social History   Socioeconomic History  . Marital status: Married    Spouse name: Not on file  . Number of children: Not on file  . Years of education: Not on file  . Highest education level: Not on file  Occupational History  . Not on file  Social Needs  . Financial resource strain: Not on file  . Food insecurity:    Worry: Not on file    Inability: Not on file  . Transportation needs:    Medical: Not on file    Non-medical: Not on file  Tobacco Use  . Smoking status: Never Smoker  .  Smokeless tobacco: Never Used  Substance and Sexual Activity  . Alcohol use: Never    Frequency: Never  . Drug use: Never  . Sexual activity: Not on file  Lifestyle  . Physical activity:    Days per week: Not on file    Minutes per session: Not on file  . Stress: Not on file  Relationships  . Social connections:    Talks on phone: Not on file    Gets together: Not on file    Attends religious service: Not on file    Active member of club or organization: Not on file    Attends meetings of clubs or organizations: Not on file    Relationship status: Not on file  Other Topics Concern  . Not on file  Social History Narrative  . Not on file     Family History: The patient's family history includes Colon cancer in his father; Dementia in his mother.  ROS:   Please see the history of present illness.    Denies any fevers chills nausea vomiting syncope bleeding all other systems reviewed and are negative.  EKGs/Labs/Other Studies Reviewed:    The following studies were reviewed today:  CORONARY STENT INTERVENTION9/2019  LEFT HEART CATH AND CORONARY ANGIOGRAPHY  Conclusion     A stent was successfully placed.   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  Short left main without significant obstruction  Circumflex with luminal irregularities in the proximal vessel but no high-grade obstruction.  Dominant normal right coronary.  Normal left ventricular size. LVEDP 8 mmHg.  Successful drug-eluting stent implantation mid LAD segment reducing the 95% stenosis to 0% with TIMI grade III flow. Synergy 16 x 3.5 mm stent postdilated to 3.75 mm at the distal margin was deployed.   EchoStudy Conclusions9/2019  - Left ventricle: The cavity size was normal. Systolic function was moderately reduced. The estimated ejection fraction was in the range of 35% to 40%. There is akinesis of the midanteroseptal and inferoseptal myocardium. There  is akinesis of the apical anterior, apical septal and apical myocardium. There was an increased relative contribution of atrial contraction to ventricular filling. Doppler parameters are consistent with abnormal left ventricular relaxation (grade 1 diastolic dysfunction). - Aortic valve: Trileaflet; mildly thickened, mildly calcified leaflets. - Mitral valve: There was mild regurgitation. - Right ventricle: Systolic function was mildly reduced. - Tricuspid valve: There was mild regurgitation.  Recommendations: Recommend definity contrast study to rule out apical LV thrombus.   Echo LimitedStudy Conclusions9/2019  - Left ventricle: Possible early forming thrombus in LV apex seen best on apical 3 chamber view. Systolic function was moderately to severely reduced. The estimated ejection fraction was 35%. There  is akinesis of the midanteroseptal and inferoseptal myocardium. There is akinesis of the apicalanterior and apical myocardium.  Echocardiogram 09/06/2018: - Left ventricle: The cavity size was normal. Wall thickness was   normal. Systolic function was mildly reduced. The estimated   ejection fraction was in the range of 45% to 50%. Diffuse   hypokinesis. Doppler parameters are consistent with abnormal left   ventricular relaxation (grade 1 diastolic dysfunction).  Impressions:  - Definity used; mild global reduction in LV systolic function;   mild diastolic dysfunction; no LV apical thrombus.   EKG:  EKG is not ordered today.    Recent Labs: 07/26/2018: ALT 35; BUN 12; Creatinine, Ser 0.96; Hemoglobin 14.4; Platelets 233; Potassium 4.4; Sodium 138  Recent Lipid Panel    Component Value Date/Time   CHOL 85 (L) 07/26/2018 1053   TRIG 79 07/26/2018 1053   HDL 33 (L) 07/26/2018 1053   CHOLHDL 2.6 07/26/2018 1053   CHOLHDL 4.3 05/31/2018 0323   VLDL 31 05/31/2018 0323   LDLCALC 36 07/26/2018 1053    Physical Exam:    VS:  BP  110/66   Pulse 73   Ht 5' 11.5" (1.816 m)   Wt 190 lb 12.8 oz (86.5 kg)   SpO2 97%   BMI 26.24 kg/m     Wt Readings from Last 3 Encounters:  09/13/18 190 lb 12.8 oz (86.5 kg)  08/17/18 191 lb (86.6 kg)  07/26/18 192 lb 12.8 oz (87.5 kg)     GEN:  Well nourished, well developed in no acute distress HEENT: Normal NECK: No JVD; No carotid bruits LYMPHATICS: No lymphadenopathy CARDIAC: RRR, no murmurs, rubs, gallops RESPIRATORY:  Clear to auscultation without rales, wheezing or rhonchi  ABDOMEN: Soft, non-tender, non-distended MUSCULOSKELETAL:  No edema; No deformity  SKIN: Warm and dry NEUROLOGIC:  Alert and oriented x 3 PSYCHIATRIC:  Normal affect   ASSESSMENT:    1. Ischemic cardiomyopathy   2. Coronary artery disease due to lipid rich plaque   3. Acute thrombus of left ventricle (HCC)   4. Other hyperlipidemia    PLAN:    In order of problems listed above:  Coronary artery disease, non-ST elevation myocardial infarction, PCI to mid LAD, DES - Chest pain has resolved.  Doing well.  Cardiac catheterization reviewed as above.  Continue with aggressive secondary risk factor prevention.  Ischemic cardiomyopathy -EF 35 to 40%, now 50% excellent. - Angiotensin receptor blocker blood pressure, now up to 50.  Doing very well.  Okay to check a basic metabolic profile today.  Anticoagulation - Eliquis/Plavix.  We are going to be able to stop his Eliquis now.  LV thrombus-resolved - Eliquis was utilized but in September 06, 2018, there was no further evidence of apical thrombus.  EF was improved. -We will go ahead and stop his Eliquis now.  Resume aspirin 81.  Drop aspirin in September.   Medication Adjustments/Labs and Tests Ordered: Current medicines are reviewed at length with the patient today.  Concerns regarding medicines are outlined above.  No orders of the defined types were placed in this encounter.  Meds ordered this encounter  Medications  . aspirin EC 81 MG  tablet    Sig: Take 1 tablet (81 mg total) by mouth daily.    Dispense:  90 tablet    Refill:  3    Patient Instructions  Medication Instructions:  You may discontinue your Eliquis.  Start Aspirin 81 mg a day. You may discontinue it after 05/2019. Continue all other  medications as listed.  If you need a refill on your cardiac medications before your next appointment, please call your pharmacy.    Follow-Up: At [facility], you and your health needs are our priority.  As part of our continuing mission to provide you with exceptional heart care, we have created designated Provider Care Teams.  These Care Teams include your primary Cardiologist (physician) and Advanced Practice Providers (APPs -  Physician Assistants and Nurse Practitioners) who all work together to provide you with the care you need, when you need it. You will need a follow up appointment in 12 months.  Please call our office 2 months in advance to schedule this appointment.  You may see Donato SchultzMark Cletis Muma, MD or one of the following Advanced Practice Providers on your designated Care Team:   Norma FredricksonLori Gerhardt, NP Nada BoozerLaura Ingold, NP . Georgie ChardJill McDaniel, NP  Thank you for choosing [facility]!!         Signed, Donato SchultzMark Yvonna Brun, MD  09/13/2018 10:22 AM    [facility]

## 2018-09-28 ENCOUNTER — Other Ambulatory Visit: Payer: Self-pay | Admitting: Cardiology

## 2018-12-21 ENCOUNTER — Other Ambulatory Visit: Payer: Self-pay | Admitting: Medical

## 2019-04-14 IMAGING — US US EXTREM LOW VENOUS*R*
1 series · 13 of 24 positions shown · non-contrast
Comparison: None.

CLINICAL DATA: Acute right lower extremity swelling.



[Series 1: us extrem low venous*right* · 0.10mm/px · 49 acquisitions, 13 frames shown]
[im 1/49]
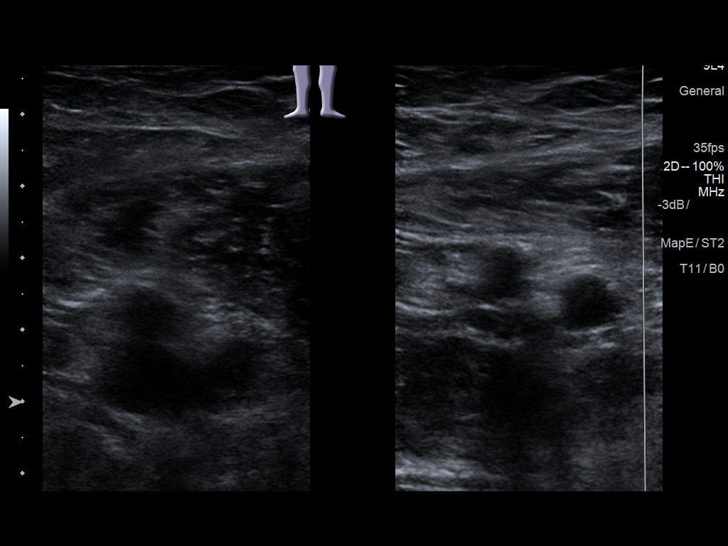
[im 5/49]
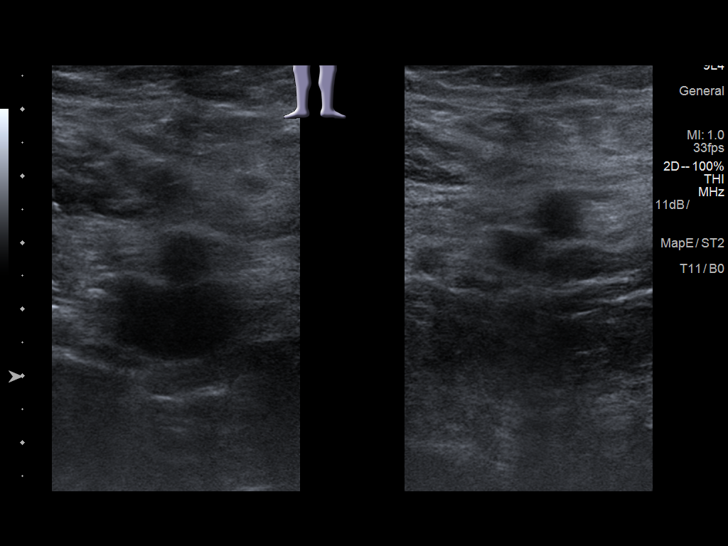
[im 9/49]
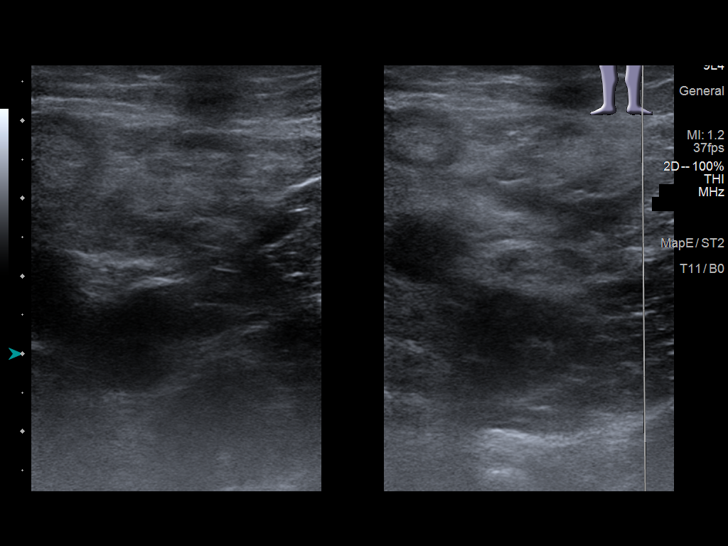
[im 13/49]
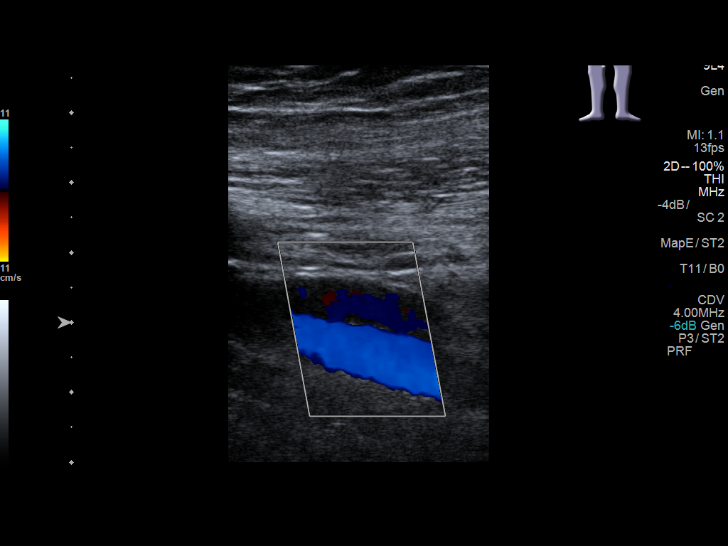
[im 17/49]
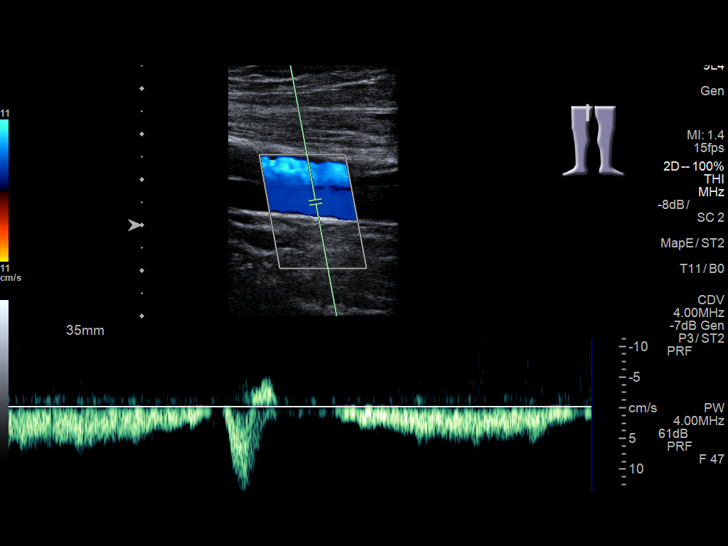
[im 21/49]
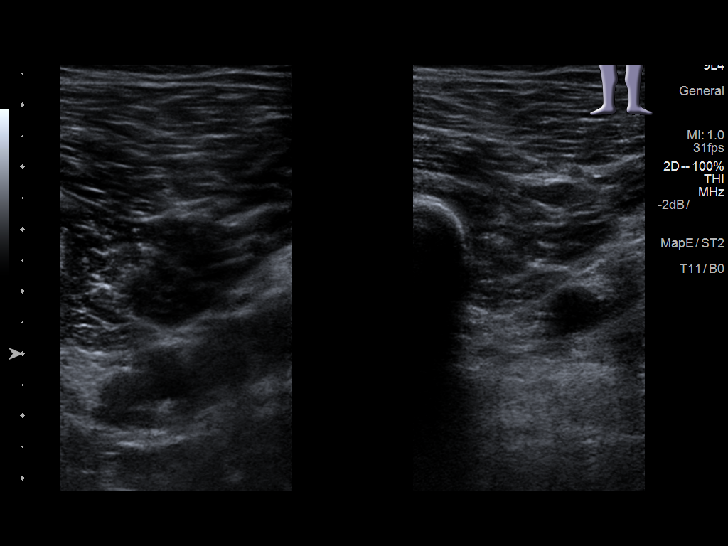
[im 28/49]
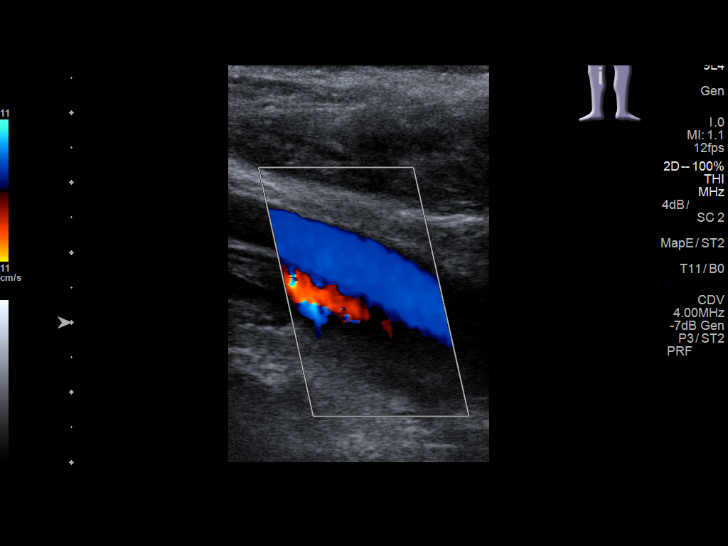
[im 30/49]
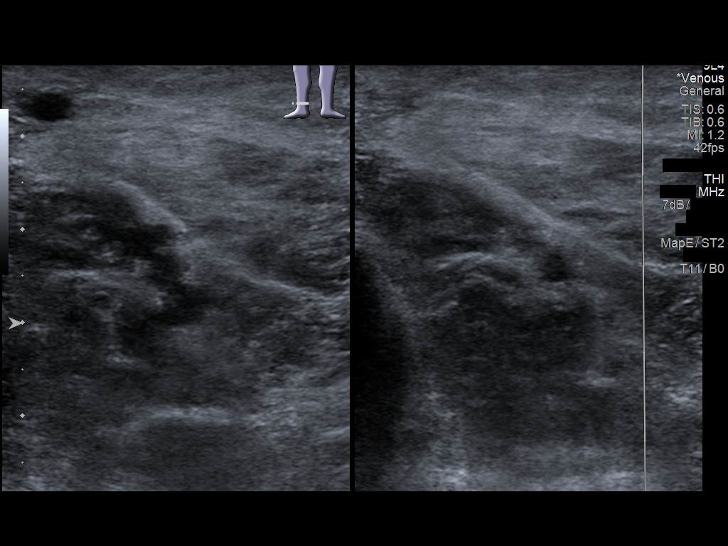
[im 34/49]
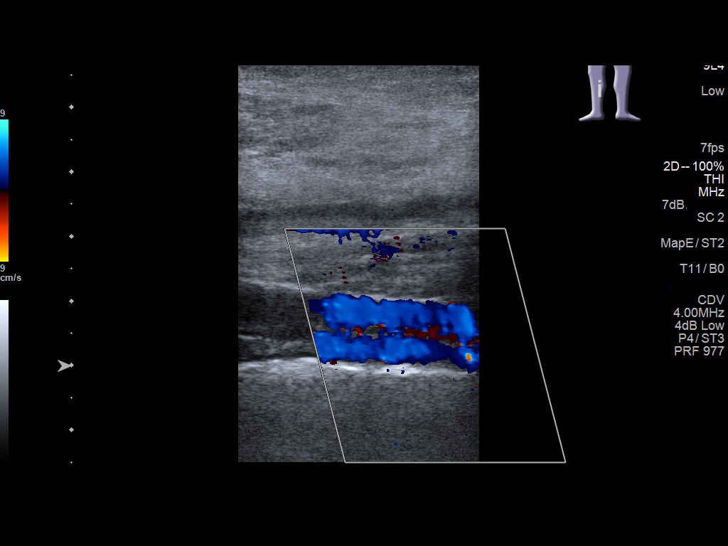
[im 38/49]
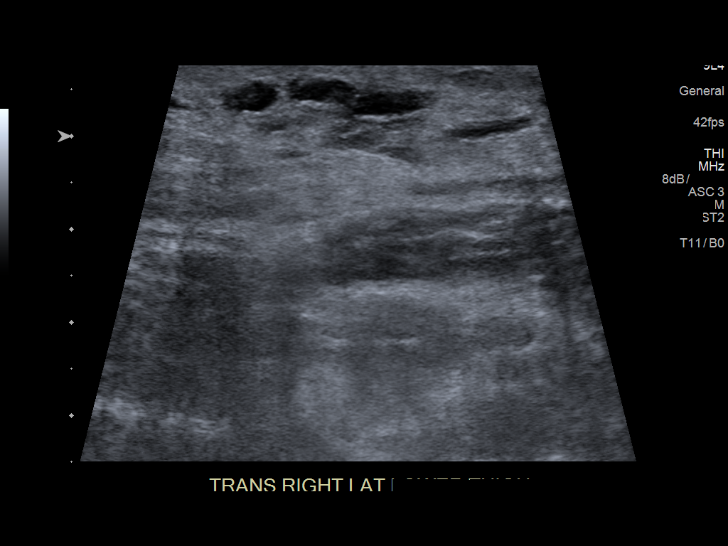
[im 42/49]
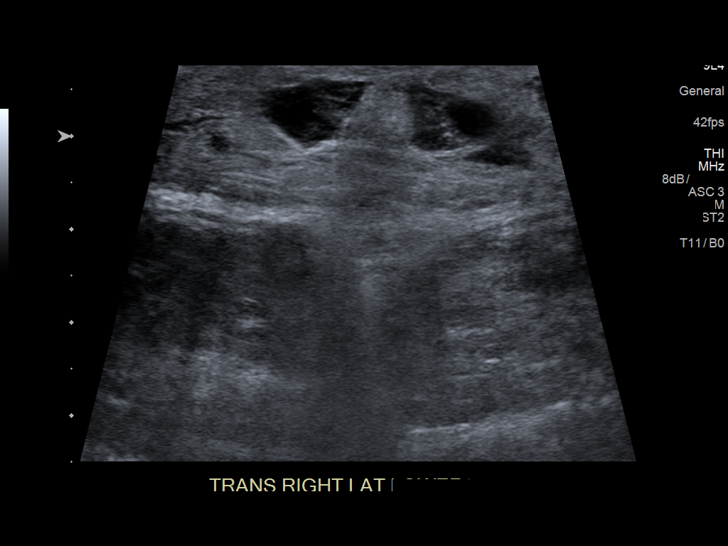
[im 46/49]
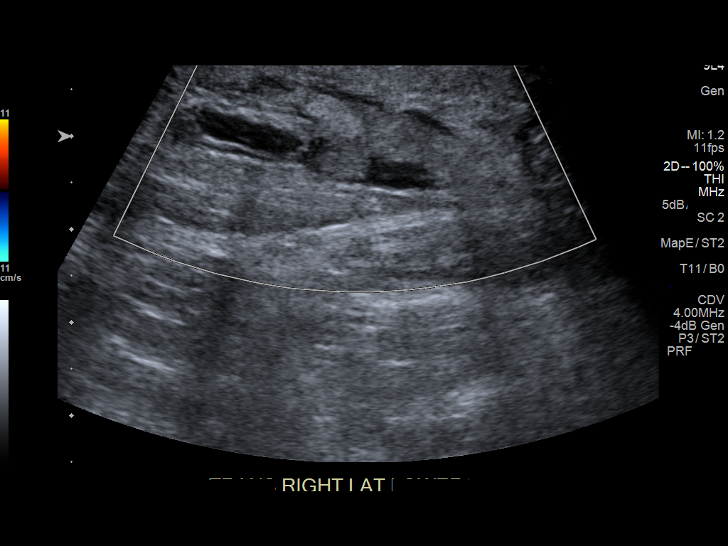
[im 49/49]
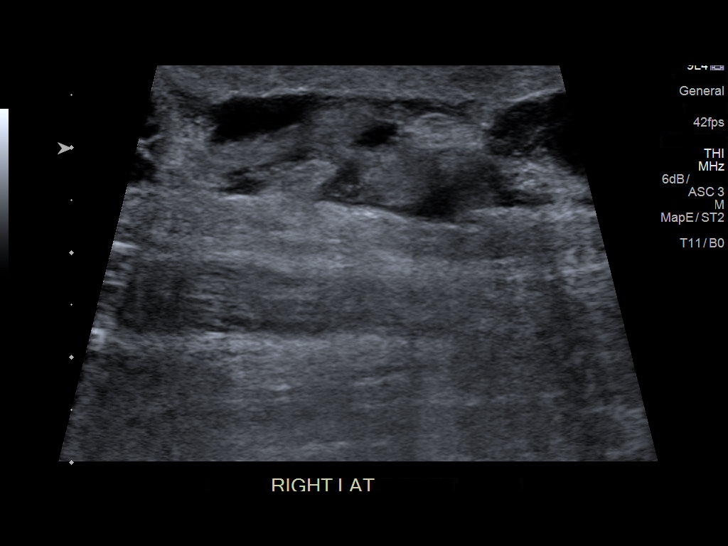

[13 of 24 positions shown; findings below may reference images not displayed]

FINDINGS: Contralateral Common Femoral Vein: Respiratory phasicity is normal
and symmetric with the symptomatic side. No evidence of thrombus.
Normal compressibility.

Common Femoral Vein: No evidence of thrombus. Normal
compressibility, respiratory phasicity and response to augmentation.

Saphenofemoral Junction: No evidence of thrombus. Normal
compressibility and flow on color Doppler imaging.

Profunda Femoral Vein: No evidence of thrombus. Normal
compressibility and flow on color Doppler imaging.

Femoral Vein: No evidence of thrombus. Normal compressibility,
respiratory phasicity and response to augmentation.

Popliteal Vein: No evidence of thrombus. Normal compressibility,
respiratory phasicity and response to augmentation.

Calf Veins: No evidence of thrombus. Normal compressibility and flow
on color Doppler imaging.

Venous Reflux:  None.

Other Findings: Large complex abnormality is noted laterally in the
right thigh most consistent with hematoma.
IMPRESSION: No evidence of deep venous thrombosis seen in right lower extremity.
Probable hematoma seen in subcutaneous tissues laterally in right
thigh.

## 2019-05-08 IMAGING — DX DG CHEST 2V
2 series · 2 of 2 positions shown · non-contrast
Comparison: 10/14/2004

CLINICAL DATA: Left chest pain

EXAM:
CHEST - 2 VIEW

[chest pa]
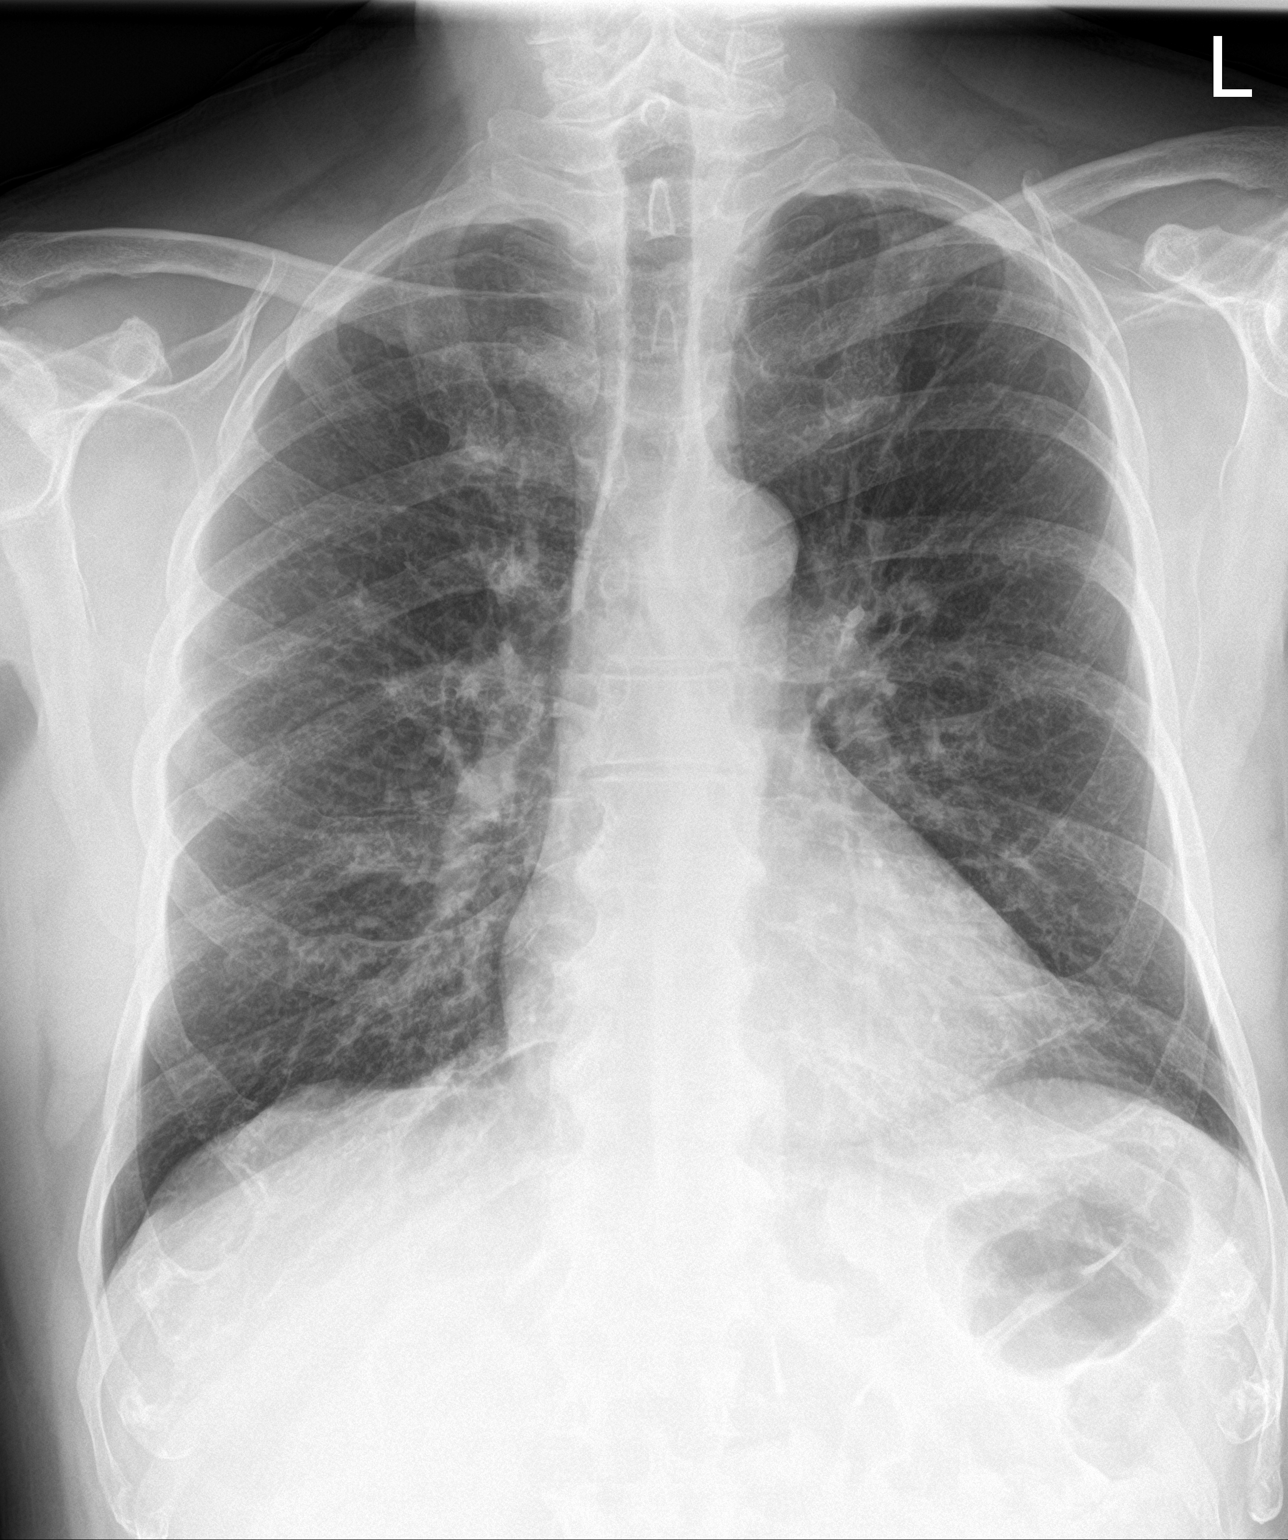

[chest lat]
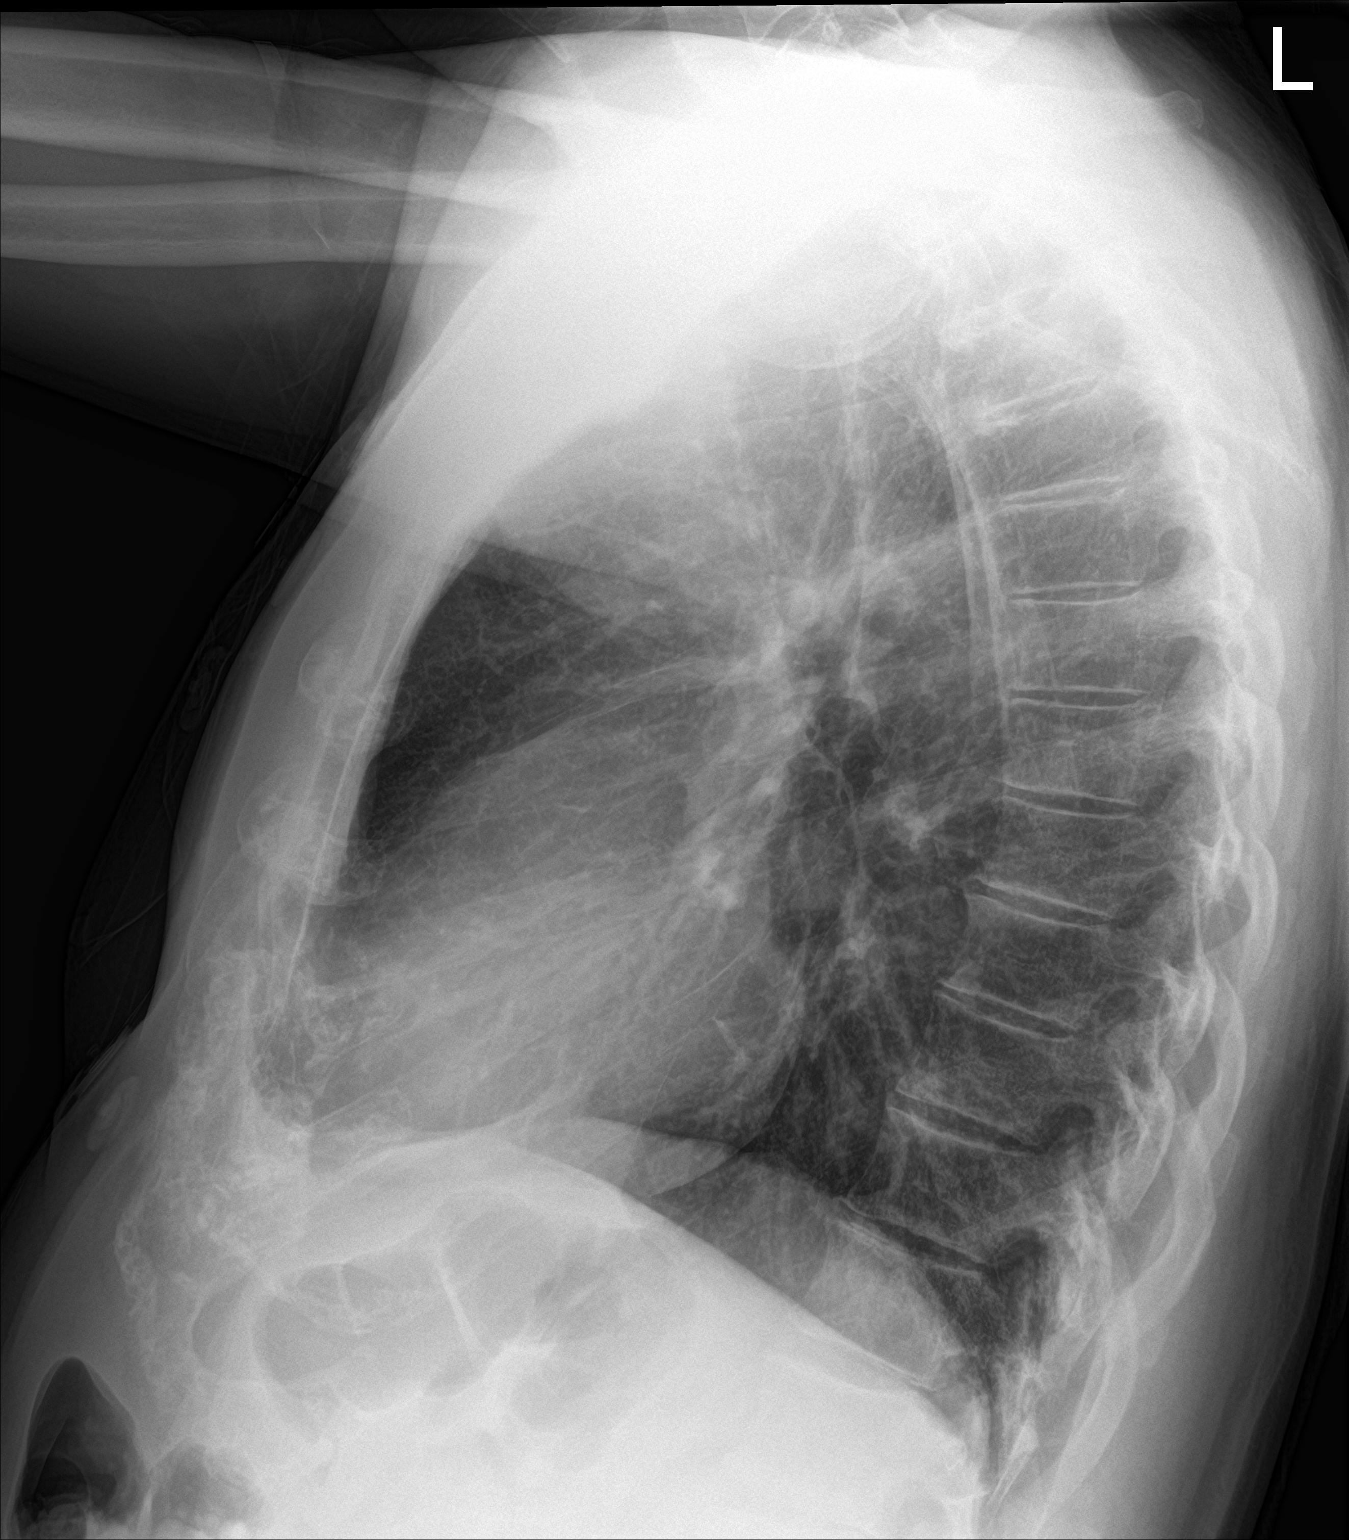

[2 of 2 positions shown; findings below may reference images not displayed]

FINDINGS: Heart and mediastinal contours are within normal limits. No focal
opacities or effusions. No acute bony abnormality.
IMPRESSION: No active cardiopulmonary disease.

## 2019-05-26 ENCOUNTER — Other Ambulatory Visit: Payer: Self-pay | Admitting: Medical

## 2019-05-27 ENCOUNTER — Other Ambulatory Visit: Payer: Self-pay | Admitting: Cardiology

## 2019-05-27 MED ORDER — ATORVASTATIN CALCIUM 80 MG PO TABS: 80.0000 mg | ORAL_TABLET | Freq: Every day | ORAL | 0 refills | Status: DC

## 2019-08-15 ENCOUNTER — Other Ambulatory Visit: Payer: Self-pay | Admitting: Cardiology

## 2019-08-17 ENCOUNTER — Other Ambulatory Visit: Payer: Self-pay | Admitting: Cardiology

## 2019-09-12 ENCOUNTER — Other Ambulatory Visit: Payer: Self-pay | Admitting: Cardiology

## 2019-09-14 ENCOUNTER — Other Ambulatory Visit: Payer: Self-pay

## 2019-09-14 ENCOUNTER — Encounter: Payer: Self-pay | Admitting: Cardiology

## 2019-09-14 ENCOUNTER — Ambulatory Visit: Payer: Medicare PPO | Admitting: Cardiology

## 2019-09-14 VITALS — BP 124/72 | HR 73 | Ht 71.0 in | Wt 192.0 lb

## 2019-09-14 DIAGNOSIS — I5022 Chronic systolic (congestive) heart failure: Secondary | ICD-10-CM | POA: Diagnosis not present

## 2019-09-14 DIAGNOSIS — E782 Mixed hyperlipidemia: Secondary | ICD-10-CM

## 2019-09-14 DIAGNOSIS — I2510 Atherosclerotic heart disease of native coronary artery without angina pectoris: Secondary | ICD-10-CM | POA: Diagnosis not present

## 2019-09-14 DIAGNOSIS — Z7901 Long term (current) use of anticoagulants: Secondary | ICD-10-CM

## 2019-09-14 DIAGNOSIS — I2583 Coronary atherosclerosis due to lipid rich plaque: Secondary | ICD-10-CM

## 2019-09-14 LAB — COMPREHENSIVE METABOLIC PANEL
ALT: 27 IU/L (ref 0–44)
AST: 23 IU/L (ref 0–40)
Albumin/Globulin Ratio: 1.4 (ref 1.2–2.2)
Albumin: 3.8 g/dL (ref 3.7–4.7)
Alkaline Phosphatase: 42 IU/L (ref 39–117)
BUN/Creatinine Ratio: 16 (ref 10–24)
BUN: 15 mg/dL (ref 8–27)
Bilirubin Total: 1.3 mg/dL — ABNORMAL HIGH (ref 0.0–1.2)
CO2: 25 mmol/L (ref 20–29)
Calcium: 8.6 mg/dL (ref 8.6–10.2)
Chloride: 102 mmol/L (ref 96–106)
Creatinine, Ser: 0.96 mg/dL (ref 0.76–1.27)
GFR calc Af Amer: 90 mL/min/{1.73_m2} (ref >59)
GFR calc non Af Amer: 78 mL/min/{1.73_m2} (ref >59)
Globulin, Total: 2.7 g/dL (ref 1.5–4.5)
Glucose: 158 mg/dL — ABNORMAL HIGH (ref 65–99)
Potassium: 3.7 mmol/L (ref 3.5–5.2)
Sodium: 139 mmol/L (ref 134–144)
Total Protein: 6.5 g/dL (ref 6.0–8.5)

## 2019-09-14 LAB — CBC
Hematocrit: 41 % (ref 37.5–51.0)
Hemoglobin: 14.3 g/dL (ref 13.0–17.7)
MCH: 32.4 pg (ref 26.6–33.0)
MCHC: 34.9 g/dL (ref 31.5–35.7)
MCV: 93 fL (ref 79–97)
Platelets: 214 10*3/uL (ref 150–450)
RBC: 4.41 x10E6/uL (ref 4.14–5.80)
RDW: 12.1 % (ref 11.6–15.4)
WBC: 9.4 10*3/uL (ref 3.4–10.8)

## 2019-09-14 LAB — LIPID PANEL
Chol/HDL Ratio: 2.8 ratio (ref 0.0–5.0)
Cholesterol, Total: 88 mg/dL — ABNORMAL LOW (ref 100–199)
HDL: 31 mg/dL — ABNORMAL LOW (ref >39)
LDL Chol Calc (NIH): 33 mg/dL (ref 0–99)
Triglycerides: 133 mg/dL (ref 0–149)
VLDL Cholesterol Cal: 24 mg/dL (ref 5–40)

## 2019-09-14 MED ORDER — LOSARTAN POTASSIUM 25 MG PO TABS: 25.0000 mg | ORAL_TABLET | Freq: Every day | ORAL | 2 refills | Status: DC

## 2019-09-14 NOTE — Patient Instructions (Signed)
Medication Instructions:   STOP TAKING PLAVIX NOW  CONTINUE TAKING ASPIRIN 81 MG BY MOUTH DAILY  DECREASE YOUR LOSARTAN TO 25 MG BY MOUTH DAILY  *If you need a refill on your cardiac medications before your next appointment, please call your pharmacy*   Lab Work:  TODAY--CMET, CBC, AND LIPIDS  If you have labs (blood work) drawn today and your tests are completely normal, you will receive your results only by: Marland Kitchen MyChart Message (if you have MyChart) OR . A paper copy in the mail If you have any lab test that is abnormal or we need to change your treatment, we will call you to review the results.    Follow-Up: At [facility], you and your health needs are our priority.  As part of our continuing mission to provide you with exceptional heart care, we have created designated Provider Care Teams.  These Care Teams include your primary Cardiologist (physician) and Advanced Practice Providers (APPs -  Physician Assistants and Nurse Practitioners) who all work together to provide you with the care you need, when you need it.  Your next appointment:   12 month(s)  The format for your next appointment:   In Person  Provider:   Donato Schultz, MD

## 2019-09-14 NOTE — Progress Notes (Signed)
Cardiology Office Note:    Date:  09/14/2019   ID:  THEOPHIL THIVIERGE, DOB Jun 18, 1946, MRN [id number]  PCP:  Lavone Orn, MD  Cardiologist:  Candee Furbish, MD  Electrophysiologist:  None   Referring MD: Lavone Orn, MD     History of Present Illness:    ASA BAUDOIN is a 74 y.o. male here for follow-up of coronary artery disease.  LV thrombus previously noted when EF was 35%.  Was on Plavix and Eliquis at the same time.   No ACE inhibitor was started at the time because of blood pressure issue.  Fish oil was previously stopped because of anticoagulation.     His repeat echocardiogram showed improvement in his EF to 50%.  His apical thrombus had resolved.  We decided to stop his Eliquis.    09/14/2019-here for the follow-up of CAD and prior left ventricular thrombus off of Eliquis resolved, cardiomyopathy with EF 35%.  Repeat echocardiogram showed improvement of EF to 50% and apical thrombus resolution.  At that time we stopped his Eliquis.  We have gone forward with Plavix monotherapy but he was actually taking both aspirin and Plavix up till now. 1-2 miles a day, HR low 100's. No angina. Mild orthopnea.  No myalgias with statin.    Past Medical History:  Diagnosis Date  . HLD (hyperlipidemia)   . Ischemic cardiomyopathy   . Ischemic heart disease     Past Surgical History:  Procedure Laterality Date  . CORONARY STENT INTERVENTION N/A 05/31/2018   Procedure: CORONARY STENT INTERVENTION;  Surgeon: Belva Crome, MD;  Location: [facility];  Service: Cardiovascular;  Laterality: N/A;  . LEFT HEART CATH AND CORONARY ANGIOGRAPHY N/A 05/31/2018   Procedure: LEFT HEART CATH AND CORONARY ANGIOGRAPHY;  Surgeon: Belva Crome, MD;  Location: [facility];  Service: Cardiovascular;  Laterality: N/A;  . ULTRASOUND GUIDANCE FOR VASCULAR ACCESS  05/31/2018   Procedure: Ultrasound Guidance For Vascular Access;  Surgeon: Belva Crome, MD;  Location: [facility];   Service: Cardiovascular;;    Current Medications: Current Meds  Medication Sig  . aspirin EC 81 MG tablet Take 1 tablet (81 mg total) by mouth daily.  Marland Kitchen atorvastatin (LIPITOR) 80 MG tablet TAKE 1 TABLET AT 6PM.  . bimatoprost (LUMIGAN) 0.01 % SOLN Place 1 drop into both eyes at bedtime.  . Coenzyme Q10 (CO Q 10 PO) Take 200 mg by mouth daily.   . Cyanocobalamin (VITAMIN B-12 PO) Take 1 tablet by mouth daily.  . hyoscyamine (LEVSIN SL) 0.125 MG SL tablet Place 0.125 mg under the tongue daily as needed for cramping.   . metoprolol succinate (TOPROL-XL) 25 MG 24 hr tablet TAKE 1 TABLET EACH DAY.  . Multiple Vitamin (MULTIVITAMIN WITH MINERALS) TABS tablet Take 1 tablet by mouth daily.  . naphazoline-pheniramine (NAPHCON-A) 0.025-0.3 % ophthalmic solution Place 1 drop into both eyes 5 (five) times daily as needed for eye irritation (dry eyes/seasonal allergies).  . nitroGLYCERIN (NITROSTAT) 0.4 MG SL tablet Place 1 tablet (0.4 mg total) under the tongue every 5 (five) minutes x 3 doses as needed for chest pain.  . vitamin E 400 UNIT capsule Take 400 Units by mouth daily.  . [DISCONTINUED] clopidogrel (PLAVIX) 75 MG tablet TAKE 1 TABLET ONCE DAILY.  . [DISCONTINUED] losartan (COZAAR) 50 MG tablet TAKE 1 TABLET BY MOUTH DAILY.     Allergies:   Ace inhibitors   Social History   Socioeconomic History  . Marital  status: Married    Spouse name: Not on file  . Number of children: Not on file  . Years of education: Not on file  . Highest education level: Not on file  Occupational History  . Not on file  Tobacco Use  . Smoking status: Never Smoker  . Smokeless tobacco: Never Used  Substance and Sexual Activity  . Alcohol use: Never  . Drug use: Never  . Sexual activity: Not on file  Other Topics Concern  . Not on file  Social History Narrative  . Not on file   Social Determinants of Health   Financial Resource Strain:   . Difficulty of Paying Living Expenses: Not on file  Food  Insecurity:   . Worried About Charity fundraiser in the Last Year: Not on file  . Ran Out of Food in the Last Year: Not on file  Transportation Needs:   . Lack of Transportation (Medical): Not on file  . Lack of Transportation (Non-Medical): Not on file  Physical Activity:   . Days of Exercise per Week: Not on file  . Minutes of Exercise per Session: Not on file  Stress:   . Feeling of Stress : Not on file  Social Connections:   . Frequency of Communication with Friends and Family: Not on file  . Frequency of Social Gatherings with Friends and Family: Not on file  . Attends Religious Services: Not on file  . Active Member of Clubs or Organizations: Not on file  . Attends Archivist Meetings: Not on file  . Marital Status: Not on file     Family History: The patient's family history includes Colon cancer in his father; Dementia in his mother.  ROS:   Please see the history of present illness.       EKGs/Labs/Other Studies Reviewed:    The following studies were reviewed today:  CORONARY STENT INTERVENTION9/2019  LEFT HEART CATH AND CORONARY ANGIOGRAPHY  Conclusion     A stent was successfully placed.   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  Short left main without significant obstruction  Circumflex with luminal irregularities in the proximal vessel but no high-grade obstruction.  Dominant normal right coronary.  Normal left ventricular size. LVEDP 8 mmHg.  Successful drug-eluting stent implantation mid LAD segment reducing the 95% stenosis to 0% with TIMI grade III flow. Synergy 16 x 3.5 mm stent postdilated to 3.75 mm at the distal margin was deployed.   EchoStudy Conclusions9/2019  - Left ventricle: The cavity size was normal. Systolic function was moderately reduced. The estimated ejection fraction was in the range of 35% to 40%. There is akinesis of the midanteroseptal and inferoseptal myocardium.  There is akinesis of the apical anterior, apical septal and apical myocardium. There was an increased relative contribution of atrial contraction to ventricular filling. Doppler parameters are consistent with abnormal left ventricular relaxation (grade 1 diastolic dysfunction). - Aortic valve: Trileaflet; mildly thickened, mildly calcified leaflets. - Mitral valve: There was mild regurgitation. - Right ventricle: Systolic function was mildly reduced. - Tricuspid valve: There was mild regurgitation.  Recommendations: Recommend definity contrast study to rule out apical LV thrombus.   Echo LimitedStudy Conclusions9/2019  - Left ventricle: Possible early forming thrombus in LV apex seen best on apical 3 chamber view. Systolic function was moderately to severely reduced. The estimated ejection fraction was 35%. There is akinesis of the midanteroseptal and inferoseptal myocardium. There is akinesis of the apicalanterior and apical  myocardium.  Echocardiogram 09/06/2018: - Left ventricle: The cavity size was normal. Wall thickness was   normal. Systolic function was mildly reduced. The estimated   ejection fraction was in the range of 45% to 50%. Diffuse   hypokinesis. Doppler parameters are consistent with abnormal left   ventricular relaxation (grade 1 diastolic dysfunction).  Impressions:  - Definity used; mild global reduction in LV systolic function;   mild diastolic dysfunction; no LV apical thrombus.   EKG: Sinus rhythm 73 with left axis deviation no other abnormalities.  Recent Labs: No results found for requested labs within last 8760 hours.  Recent Lipid Panel    Component Value Date/Time   CHOL 85 (L) 07/26/2018 1053   TRIG 79 07/26/2018 1053   HDL 33 (L) 07/26/2018 1053   CHOLHDL 2.6 07/26/2018 1053   CHOLHDL 4.3 05/31/2018 0323   VLDL 31 05/31/2018 0323   LDLCALC 36 07/26/2018 1053    Physical Exam:    VS:  BP 124/72    Pulse 73   Ht 5' 11" (1.803 m)   Wt 192 lb (87.1 kg)   SpO2 97%   BMI 26.78 kg/m     Wt Readings from Last 3 Encounters:  09/14/19 192 lb (87.1 kg)  09/13/18 190 lb 12.8 oz (86.5 kg)  08/17/18 191 lb (86.6 kg)     GEN: Well nourished, well developed, in no acute distress  HEENT: normal  Neck: no JVD, carotid bruits, or masses Cardiac: RRR; no murmurs, rubs, or gallops,no edema  Respiratory:  clear to auscultation bilaterally, normal work of breathing GI: soft, nontender, nondistended, + BS MS: no deformity or atrophy  Skin: warm and dry, no rash Neuro:  Alert and Oriented x 3, Strength and sensation are intact Psych: euthymic mood, full affect   ASSESSMENT:    1. Coronary artery disease due to lipid rich plaque   2. Chronic systolic heart failure ([place] [facility])   3. Chronic anticoagulation   4. Mixed hyperlipidemia    PLAN:    In order of problems listed above:  Coronary artery disease, non-ST elevation myocardial infarction, PCI to mid LAD, DES -Doing very well without any anginal symptoms.  He admits that he has been taking both Plavix and the aspirin and I would like him to be on aspirin monotherapy at this point.  No bleeding.  We will check a CBC.  Ischemic cardiomyopathy -EF 35 to 40%, now 50% excellent. -He has been having some orthostatic type symptoms.  At home sometimes his blood pressure is 100/60.  He will feel some dizziness when getting up.  We will go ahead and decrease his losartan from 50 down to 25 mg a day.  Continue with Toprol 25 low-dose.  He is not on a diuretic.  Anticoagulation-resolved -Previously on Eliquis/Plavix because of apical thrombus which has resolved on repeat echocardiogram in 2019.  We will go ahead and use aspirin monotherapy only.  Hyperlipidemia -Continue with high intensity atorvastatin.  We will check a lipid panel today.  Prior LDL 36.  Liver panel has also been ordered.  1 year f/u  Medication Adjustments/Labs and Tests  Ordered: Current medicines are reviewed at length with the patient today.  Concerns regarding medicines are outlined above.  Orders Placed This Encounter  Procedures  . Lipid Profile  . Comp Met (CMET)  . CBC  . EKG 12-Lead cs   Meds ordered this encounter  Medications  . losartan (COZAAR) 25 MG tablet    Sig: Take 1 tablet (  25 mg total) by mouth daily.    Dispense:  90 tablet    Refill:  2    Patient Instructions  Medication Instructions:   STOP TAKING PLAVIX NOW  CONTINUE TAKING ASPIRIN 81 MG BY MOUTH DAILY  DECREASE YOUR LOSARTAN TO 25 MG BY MOUTH DAILY  *If you need a refill on your cardiac medications before your next appointment, please call your pharmacy*   Lab Work:  TODAY--CMET, CBC, AND LIPIDS  If you have labs (blood work) drawn today and your tests are completely normal, you will receive your results only by: Marland Kitchen MyChart Message (if you have MyChart) OR . A paper copy in the mail If you have any lab test that is abnormal or we need to change your treatment, we will call you to review the results.    Follow-Up: At [facility], you and your health needs are our priority.  As part of our continuing mission to provide you with exceptional heart care, we have created designated Provider Care Teams.  These Care Teams include your primary Cardiologist (physician) and Advanced Practice Providers (APPs -  Physician Assistants and Nurse Practitioners) who all work together to provide you with the care you need, when you need it.  Your next appointment:   12 month(s)  The format for your next appointment:   In Person  Provider:   Candee Furbish, MD       Signed, Candee Furbish, MD  09/14/2019 9:09 AM    [place]

## 2019-09-26 ENCOUNTER — Other Ambulatory Visit: Payer: Self-pay

## 2019-09-26 ENCOUNTER — Ambulatory Visit: Payer: Medicare PPO | Attending: Internal Medicine

## 2019-09-26 DIAGNOSIS — Z20822 Contact with and (suspected) exposure to covid-19: Secondary | ICD-10-CM

## 2019-09-27 LAB — NOVEL CORONAVIRUS, NAA: SARS-CoV-2, NAA: NOT DETECTED

## 2019-10-08 ENCOUNTER — Ambulatory Visit: Payer: Medicare PPO

## 2019-10-15 ENCOUNTER — Ambulatory Visit: Payer: Medicare PPO

## 2019-10-19 ENCOUNTER — Ambulatory Visit: Payer: Medicare PPO

## 2019-12-07 ENCOUNTER — Other Ambulatory Visit: Payer: Self-pay | Admitting: Cardiology

## 2020-05-18 ENCOUNTER — Other Ambulatory Visit: Payer: Medicare PPO

## 2020-05-18 ENCOUNTER — Other Ambulatory Visit: Payer: Self-pay | Admitting: Sleep Medicine

## 2020-05-18 DIAGNOSIS — I471 Supraventricular tachycardia: Secondary | ICD-10-CM

## 2020-05-21 LAB — NOVEL CORONAVIRUS, NAA: SARS-CoV-2, NAA: NOT DETECTED

## 2020-06-07 ENCOUNTER — Other Ambulatory Visit: Payer: Self-pay | Admitting: Cardiology

## 2020-06-07 ENCOUNTER — Ambulatory Visit: Payer: Medicare PPO | Attending: Internal Medicine

## 2020-06-07 DIAGNOSIS — I5022 Chronic systolic (congestive) heart failure: Secondary | ICD-10-CM

## 2020-06-07 DIAGNOSIS — E782 Mixed hyperlipidemia: Secondary | ICD-10-CM

## 2020-06-07 DIAGNOSIS — Z7901 Long term (current) use of anticoagulants: Secondary | ICD-10-CM

## 2020-06-07 DIAGNOSIS — Z23 Encounter for immunization: Secondary | ICD-10-CM

## 2020-06-07 DIAGNOSIS — I2583 Coronary atherosclerosis due to lipid rich plaque: Secondary | ICD-10-CM

## 2020-06-07 NOTE — Progress Notes (Signed)
   Covid-19 Vaccination Clinic  Name:  Anthony Shepherd    MRN: [id number] DOB: 10/01/1945  06/07/2020  [name] was observed post Covid-19 immunization for 15 minutes without incident. He was provided with Vaccine Information Sheet and instruction to access the V-Safe system.   [name] was instructed to call 911 with any severe reactions post vaccine: Marland Kitchen Difficulty breathing  . Swelling of face and throat  . A fast heartbeat  . A bad rash all over body  . Dizziness and weakness

## 2020-06-08 DIAGNOSIS — H401112 Primary open-angle glaucoma, right eye, moderate stage: Secondary | ICD-10-CM | POA: Diagnosis not present

## 2020-06-08 DIAGNOSIS — H401121 Primary open-angle glaucoma, left eye, mild stage: Secondary | ICD-10-CM | POA: Diagnosis not present

## 2020-06-21 DIAGNOSIS — Z23 Encounter for immunization: Secondary | ICD-10-CM | POA: Diagnosis not present

## 2020-07-12 DIAGNOSIS — D225 Melanocytic nevi of trunk: Secondary | ICD-10-CM | POA: Diagnosis not present

## 2020-07-12 DIAGNOSIS — Z85828 Personal history of other malignant neoplasm of skin: Secondary | ICD-10-CM | POA: Diagnosis not present

## 2020-07-12 DIAGNOSIS — X32XXXA Exposure to sunlight, initial encounter: Secondary | ICD-10-CM | POA: Diagnosis not present

## 2020-07-12 DIAGNOSIS — D2262 Melanocytic nevi of left upper limb, including shoulder: Secondary | ICD-10-CM | POA: Diagnosis not present

## 2020-07-12 DIAGNOSIS — L570 Actinic keratosis: Secondary | ICD-10-CM | POA: Diagnosis not present

## 2020-07-12 DIAGNOSIS — D2272 Melanocytic nevi of left lower limb, including hip: Secondary | ICD-10-CM | POA: Diagnosis not present

## 2020-07-12 DIAGNOSIS — D485 Neoplasm of uncertain behavior of skin: Secondary | ICD-10-CM | POA: Diagnosis not present

## 2020-07-12 DIAGNOSIS — D2261 Melanocytic nevi of right upper limb, including shoulder: Secondary | ICD-10-CM | POA: Diagnosis not present

## 2020-07-12 DIAGNOSIS — C44519 Basal cell carcinoma of skin of other part of trunk: Secondary | ICD-10-CM | POA: Diagnosis not present

## 2020-08-27 DIAGNOSIS — H01024 Squamous blepharitis left upper eyelid: Secondary | ICD-10-CM | POA: Diagnosis not present

## 2020-08-27 DIAGNOSIS — H01021 Squamous blepharitis right upper eyelid: Secondary | ICD-10-CM | POA: Diagnosis not present

## 2020-08-27 DIAGNOSIS — H401121 Primary open-angle glaucoma, left eye, mild stage: Secondary | ICD-10-CM | POA: Diagnosis not present

## 2020-08-27 DIAGNOSIS — H401112 Primary open-angle glaucoma, right eye, moderate stage: Secondary | ICD-10-CM | POA: Diagnosis not present

## 2020-09-14 ENCOUNTER — Encounter: Payer: Self-pay | Admitting: Cardiology

## 2020-09-14 ENCOUNTER — Ambulatory Visit: Payer: Medicare PPO | Admitting: Cardiology

## 2020-09-14 ENCOUNTER — Other Ambulatory Visit: Payer: Self-pay

## 2020-09-14 VITALS — BP 130/90 | HR 78 | Ht 71.0 in | Wt 194.6 lb

## 2020-09-14 DIAGNOSIS — I2583 Coronary atherosclerosis due to lipid rich plaque: Secondary | ICD-10-CM | POA: Diagnosis not present

## 2020-09-14 DIAGNOSIS — I2510 Atherosclerotic heart disease of native coronary artery without angina pectoris: Secondary | ICD-10-CM

## 2020-09-14 DIAGNOSIS — I5022 Chronic systolic (congestive) heart failure: Secondary | ICD-10-CM | POA: Diagnosis not present

## 2020-09-14 NOTE — Patient Instructions (Signed)
Medication Instructions:  The current medical regimen is effective;  continue present plan and medications.  *If you need a refill on your cardiac medications before your next appointment, please call your pharmacy*  Follow-Up: At [facility], you and your health needs are our priority.  As part of our continuing mission to provide you with exceptional heart care, we have created designated Provider Care Teams.  These Care Teams include your primary Cardiologist (physician) and Advanced Practice Providers (APPs -  Physician Assistants and Nurse Practitioners) who all work together to provide you with the care you need, when you need it.  We recommend signing up for the patient portal called "MyChart".  Sign up information is provided on this After Visit Summary.  MyChart is used to connect with patients for Virtual Visits (Telemedicine).  Patients are able to view lab/test results, encounter notes, upcoming appointments, etc.  Non-urgent messages can be sent to your provider as well.   To learn more about what you can do with MyChart, go to [email or web address].    Your next appointment:   12 month(s)  The format for your next appointment:   In Person  Provider:   Mark Skains, MD   Thank you for choosing [facility]!!      

## 2020-09-14 NOTE — Progress Notes (Signed)
Cardiology Office Note:    Date:  09/14/2020   ID:  Anthony Shepherd, DOB 1946-07-10, MRN [id number]  PCP:  Kirby Funk, MD  [facility] Cardiologist:  Donato Schultz, MD  [facility] Electrophysiologist:  None   Referring MD: Kirby Funk, MD    History of Present Illness:    Anthony Shepherd is a 75 y.o. male artery disease follow-up. LV thrombus previously noted when EF was 35%.  At that time was on both Plavix and Eliquis.  We take care of Anthony Shepherd his wife.  She had COVID September 2021 and then had a repeat hospitalization in Christmas with intubation.  Non-COVID related.  Dog is his son-in-law.  Fish oil was previously stopped because of anticoagulation and increased bleeding risk.  Repeat echocardiogram eventually showed EF of 50%.  Apical thrombus had resolved.  At that time we decided to stop his Eliquis.  Plavix and aspirin going forward.  Doing well no anginal symptoms no myalgias with statin.  Past Medical History:  Diagnosis Date  . HLD (hyperlipidemia)   . Ischemic cardiomyopathy   . Ischemic heart disease     Past Surgical History:  Procedure Laterality Date  . CORONARY STENT INTERVENTION N/A 05/31/2018   Procedure: CORONARY STENT INTERVENTION;  Surgeon: Lyn Records, MD;  Location: [facility];  Service: Cardiovascular;  Laterality: N/A;  . LEFT HEART CATH AND CORONARY ANGIOGRAPHY N/A 05/31/2018   Procedure: LEFT HEART CATH AND CORONARY ANGIOGRAPHY;  Surgeon: Lyn Records, MD;  Location: [facility];  Service: Cardiovascular;  Laterality: N/A;  . ULTRASOUND GUIDANCE FOR VASCULAR ACCESS  05/31/2018   Procedure: Ultrasound Guidance For Vascular Access;  Surgeon: Lyn Records, MD;  Location: [facility];  Service: Cardiovascular;;    Current Medications: Current Meds  Medication Sig  . aspirin EC 81 MG tablet Take 1 tablet (81 mg total) by mouth daily.  Marland Kitchen atorvastatin (LIPITOR) 80 MG tablet TAKE 1 TABLET AT 6PM.  . bimatoprost  (LUMIGAN) 0.01 % SOLN Place 1 drop into both eyes at bedtime.  . Coenzyme Q10 (CO Q 10 PO) Take 200 mg by mouth daily.   . Cyanocobalamin (VITAMIN B-12 PO) Take 1 tablet by mouth daily.  . hyoscyamine (LEVSIN SL) 0.125 MG SL tablet Place 0.125 mg under the tongue daily as needed for cramping.   Marland Kitchen losartan (COZAAR) 25 MG tablet TAKE 1 TABLET BY MOUTH DAILY.  . metoprolol succinate (TOPROL-XL) 25 MG 24 hr tablet TAKE 1 TABLET EACH DAY.  . Multiple Vitamin (MULTIVITAMIN WITH MINERALS) TABS tablet Take 1 tablet by mouth daily.  . naphazoline-pheniramine (NAPHCON-A) 0.025-0.3 % ophthalmic solution Place 1 drop into both eyes 5 (five) times daily as needed for eye irritation (dry eyes/seasonal allergies).  . nitroGLYCERIN (NITROSTAT) 0.4 MG SL tablet Place 1 tablet (0.4 mg total) under the tongue every 5 (five) minutes x 3 doses as needed for chest pain.  . [DISCONTINUED] vitamin E 400 UNIT capsule Take 400 Units by mouth daily.     Allergies:   Ace inhibitors   Social History   Socioeconomic History  . Marital status: Married    Spouse name: Not on file  . Number of children: Not on file  . Years of education: Not on file  . Highest education level: Not on file  Occupational History  . Not on file  Tobacco Use  . Smoking status: Never Smoker  . Smokeless tobacco: Never Used  Vaping Use  . Vaping  Use: Never used  Substance and Sexual Activity  . Alcohol use: Never  . Drug use: Never  . Sexual activity: Not on file  Other Topics Concern  . Not on file  Social History Narrative  . Not on file   Social Determinants of Health   Financial Resource Strain: Not on file  Food Insecurity: Not on file  Transportation Needs: Not on file  Physical Activity: Not on file  Stress: Not on file  Social Connections: Not on file     Family History: The patient's family history includes Colon cancer in his father; Dementia in his mother.  ROS:   Please see the history of present illness.      All other systems reviewed and are negative.  EKGs/Labs/Other Studies Reviewed:    The following studies were reviewed today:   CORONARY STENT INTERVENTION9/2019  LEFT HEART CATH AND CORONARY ANGIOGRAPHY  Conclusion     A stent was successfully placed.   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  Short left main without significant obstruction  Circumflex with luminal irregularities in the proximal vessel but no high-grade obstruction.  Dominant normal right coronary.  Normal left ventricular size. LVEDP 8 mmHg.  Successful drug-eluting stent implantation mid LAD segment reducing the 95% stenosis to 0% with TIMI grade III flow. Synergy 16 x 3.5 mm stent postdilated to 3.75 mm at the distal margin was deployed.   EchoStudy Conclusions9/2019  - Left ventricle: The cavity size was normal. Systolic function was moderately reduced. The estimated ejection fraction was in the range of 35% to 40%. There is akinesis of the midanteroseptal and inferoseptal myocardium. There is akinesis of the apical anterior, apical septal and apical myocardium. There was an increased relative contribution of atrial contraction to ventricular filling. Doppler parameters are consistent with abnormal left ventricular relaxation (grade 1 diastolic dysfunction). - Aortic valve: Trileaflet; mildly thickened, mildly calcified leaflets. - Mitral valve: There was mild regurgitation. - Right ventricle: Systolic function was mildly reduced. - Tricuspid valve: There was mild regurgitation.  Recommendations: Recommend definity contrast study to rule out apical LV thrombus.   Echo LimitedStudy Conclusions9/2019  - Left ventricle: Possible early forming thrombus in LV apex seen best on apical 3 chamber view. Systolic function was moderately to severely reduced. The estimated ejection fraction was 35%. There is akinesis of the  midanteroseptal and inferoseptal myocardium. There is akinesis of the apicalanterior and apical myocardium.  Echocardiogram 09/06/2018: - Left ventricle: The cavity size was normal. Wall thickness was normal. Systolic function was mildly reduced. The estimated ejection fraction was in the range of 45% to 50%. Diffuse hypokinesis. Doppler parameters are consistent with abnormal left ventricular relaxation (grade 1 diastolic dysfunction).  Impressions:  - Definity used; mild global reduction in LV systolic function; mild diastolic dysfunction; no LV apical thrombus.    EKG:  EKG is  ordered today.  The ekg ordered today demonstrates sinus rhythm 78 no other abnormalities.  Recent Labs: No results found for requested labs within last 8760 hours.  Recent Lipid Panel    Component Value Date/Time   CHOL 88 (L) 09/14/2019 0913   TRIG 133 09/14/2019 0913   HDL 31 (L) 09/14/2019 0913   CHOLHDL 2.8 09/14/2019 0913   CHOLHDL 4.3 05/31/2018 0323   VLDL 31 05/31/2018 0323   LDLCALC 33 09/14/2019 0913     Risk Assessment/Calculations:       Physical Exam:    VS:  BP 130/90 (BP Location: Left  Arm, Patient Position: Sitting, Cuff Size: Normal)   Pulse 78   Ht 5\' 11"  (1.803 m)   Wt 194 lb 9.6 oz (88.3 kg)   BMI 27.14 kg/m     Wt Readings from Last 3 Encounters:  09/14/20 194 lb 9.6 oz (88.3 kg)  09/14/19 192 lb (87.1 kg)  09/13/18 190 lb 12.8 oz (86.5 kg)     GEN:  Well nourished, well developed in no acute distress HEENT: Normal NECK: No JVD; No carotid bruits LYMPHATICS: No lymphadenopathy CARDIAC: RRR, no murmurs, rubs, gallops RESPIRATORY:  Clear to auscultation without rales, wheezing or rhonchi  ABDOMEN: Soft, non-tender, non-distended MUSCULOSKELETAL:  No edema; No deformity  SKIN: Warm and dry NEUROLOGIC:  Alert and oriented x 3 PSYCHIATRIC:  Normal affect   ASSESSMENT:    1. Chronic systolic heart failure ([place])   2. Coronary artery disease  due to lipid rich plaque    PLAN:    In order of problems listed above:   Coronary artery disease, non-ST elevation myocardial infarction, PCI to mid LAD, DES -Continues to do well without any significant anginal symptoms.  Doing well.  No bleeding.  Now on aspirin monotherapy.   Ischemic cardiomyopathy -EF 35 to 40%, now 50% excellent. -He has been having some orthostatic type symptoms.  At home sometimes his blood pressure is 100/60.  He will feel some dizziness when getting up.  We will go ahead and decrease his losartan from 50 down to 25 mg a day.  Continue with Toprol 25 low-dose.  He is not on a diuretic.  Anticoagulation-resolved -Previously on Eliquis/Plavix because of apical thrombus which has resolved on repeat echocardiogram in 2019.  We will go ahead and use aspirin monotherapy only.  Hyperlipidemia  -Continue with high intensity atorvastatin.  Prior LDL 33.  Dr. Laurann Montana has been checking labs.  1 year f/u         Medication Adjustments/Labs and Tests Ordered: Current medicines are reviewed at length with the patient today.  Concerns regarding medicines are outlined above.  Orders Placed This Encounter  Procedures  . EKG 12-Lead   No orders of the defined types were placed in this encounter.   Patient Instructions  Medication Instructions:  The current medical regimen is effective;  continue present plan and medications.  *If you need a refill on your cardiac medications before your next appointment, please call your pharmacy*  Follow-Up: At [facility], you and your health needs are our priority.  As part of our continuing mission to provide you with exceptional heart care, we have created designated Provider Care Teams.  These Care Teams include your primary Cardiologist (physician) and Advanced Practice Providers (APPs -  Physician Assistants and Nurse Practitioners) who all work together to provide you with the care you need, when you need it.  We  recommend signing up for the patient portal called "MyChart".  Sign up information is provided on this After Visit Summary.  MyChart is used to connect with patients for Virtual Visits (Telemedicine).  Patients are able to view lab/test results, encounter notes, upcoming appointments, etc.  Non-urgent messages can be sent to your provider as well.   To learn more about what you can do with MyChart, go to [email or web address].    Your next appointment:   12 month(s)  The format for your next appointment:   In Person  Provider:   Candee Furbish, MD  Thank you for choosing [facility]!!  Signed, Donato Schultz, MD  09/14/2020 9:24 AM    [facility]

## 2020-11-29 DIAGNOSIS — H401112 Primary open-angle glaucoma, right eye, moderate stage: Secondary | ICD-10-CM | POA: Diagnosis not present

## 2020-11-29 DIAGNOSIS — H401121 Primary open-angle glaucoma, left eye, mild stage: Secondary | ICD-10-CM | POA: Diagnosis not present

## 2020-12-10 ENCOUNTER — Other Ambulatory Visit: Payer: Self-pay | Admitting: Cardiology

## 2020-12-10 DIAGNOSIS — I2510 Atherosclerotic heart disease of native coronary artery without angina pectoris: Secondary | ICD-10-CM

## 2020-12-10 DIAGNOSIS — I2583 Coronary atherosclerosis due to lipid rich plaque: Secondary | ICD-10-CM

## 2020-12-10 DIAGNOSIS — I5022 Chronic systolic (congestive) heart failure: Secondary | ICD-10-CM

## 2020-12-10 DIAGNOSIS — E782 Mixed hyperlipidemia: Secondary | ICD-10-CM

## 2020-12-10 DIAGNOSIS — Z7901 Long term (current) use of anticoagulants: Secondary | ICD-10-CM

## 2021-01-11 DIAGNOSIS — L918 Other hypertrophic disorders of the skin: Secondary | ICD-10-CM | POA: Diagnosis not present

## 2021-01-11 DIAGNOSIS — X32XXXA Exposure to sunlight, initial encounter: Secondary | ICD-10-CM | POA: Diagnosis not present

## 2021-01-11 DIAGNOSIS — L570 Actinic keratosis: Secondary | ICD-10-CM | POA: Diagnosis not present

## 2021-01-11 DIAGNOSIS — Z85828 Personal history of other malignant neoplasm of skin: Secondary | ICD-10-CM | POA: Diagnosis not present

## 2021-01-11 DIAGNOSIS — D2261 Melanocytic nevi of right upper limb, including shoulder: Secondary | ICD-10-CM | POA: Diagnosis not present

## 2021-01-11 DIAGNOSIS — L821 Other seborrheic keratosis: Secondary | ICD-10-CM | POA: Diagnosis not present

## 2021-01-11 DIAGNOSIS — D2272 Melanocytic nevi of left lower limb, including hip: Secondary | ICD-10-CM | POA: Diagnosis not present

## 2021-03-01 DIAGNOSIS — H401112 Primary open-angle glaucoma, right eye, moderate stage: Secondary | ICD-10-CM | POA: Diagnosis not present

## 2021-03-01 DIAGNOSIS — H401121 Primary open-angle glaucoma, left eye, mild stage: Secondary | ICD-10-CM | POA: Diagnosis not present

## 2021-03-28 DIAGNOSIS — Z8546 Personal history of malignant neoplasm of prostate: Secondary | ICD-10-CM | POA: Diagnosis not present

## 2021-03-28 DIAGNOSIS — R252 Cramp and spasm: Secondary | ICD-10-CM | POA: Diagnosis not present

## 2021-03-28 DIAGNOSIS — I252 Old myocardial infarction: Secondary | ICD-10-CM | POA: Diagnosis not present

## 2021-03-28 DIAGNOSIS — Z800 Family history of malignant neoplasm of digestive organs: Secondary | ICD-10-CM | POA: Diagnosis not present

## 2021-03-28 DIAGNOSIS — Z1389 Encounter for screening for other disorder: Secondary | ICD-10-CM | POA: Diagnosis not present

## 2021-03-28 DIAGNOSIS — E785 Hyperlipidemia, unspecified: Secondary | ICD-10-CM | POA: Diagnosis not present

## 2021-03-28 DIAGNOSIS — I2510 Atherosclerotic heart disease of native coronary artery without angina pectoris: Secondary | ICD-10-CM | POA: Diagnosis not present

## 2021-03-28 DIAGNOSIS — Z0000 Encounter for general adult medical examination without abnormal findings: Secondary | ICD-10-CM | POA: Diagnosis not present

## 2021-03-28 DIAGNOSIS — I255 Ischemic cardiomyopathy: Secondary | ICD-10-CM | POA: Diagnosis not present

## 2021-05-10 ENCOUNTER — Encounter: Payer: Self-pay | Admitting: Cardiology

## 2021-05-16 DIAGNOSIS — Z1211 Encounter for screening for malignant neoplasm of colon: Secondary | ICD-10-CM | POA: Diagnosis not present

## 2021-05-16 DIAGNOSIS — Z800 Family history of malignant neoplasm of digestive organs: Secondary | ICD-10-CM | POA: Diagnosis not present

## 2021-05-31 ENCOUNTER — Other Ambulatory Visit: Payer: Self-pay | Admitting: Cardiology

## 2021-05-31 DIAGNOSIS — E782 Mixed hyperlipidemia: Secondary | ICD-10-CM

## 2021-05-31 DIAGNOSIS — I5022 Chronic systolic (congestive) heart failure: Secondary | ICD-10-CM

## 2021-06-03 DIAGNOSIS — H401121 Primary open-angle glaucoma, left eye, mild stage: Secondary | ICD-10-CM | POA: Diagnosis not present

## 2021-06-03 DIAGNOSIS — H401112 Primary open-angle glaucoma, right eye, moderate stage: Secondary | ICD-10-CM | POA: Diagnosis not present

## 2021-09-13 ENCOUNTER — Ambulatory Visit: Payer: Medicare PPO | Admitting: Cardiology

## 2021-09-13 ENCOUNTER — Other Ambulatory Visit: Payer: Self-pay

## 2021-09-13 VITALS — BP 110/70 | HR 73 | Ht 71.0 in | Wt 186.0 lb

## 2021-09-13 DIAGNOSIS — I2583 Coronary atherosclerosis due to lipid rich plaque: Secondary | ICD-10-CM | POA: Diagnosis not present

## 2021-09-13 DIAGNOSIS — I5022 Chronic systolic (congestive) heart failure: Secondary | ICD-10-CM

## 2021-09-13 DIAGNOSIS — I2510 Atherosclerotic heart disease of native coronary artery without angina pectoris: Secondary | ICD-10-CM | POA: Diagnosis not present

## 2021-09-13 DIAGNOSIS — E7800 Pure hypercholesterolemia, unspecified: Secondary | ICD-10-CM

## 2021-09-13 NOTE — Assessment & Plan Note (Signed)
Prior LDL 36, excellent on atorvastatin 80 mg.  LDL goal less than 55.  No myalgias.  Continue to monitor.

## 2021-09-13 NOTE — Assessment & Plan Note (Addendum)
Currently on both Toprol as well as losartan.  Blood pressure 110/70.  Continue with current plan.  Ejection fraction has improved as noted above.  Used to be 35, now in the 50% range.  Prior history of thrombus in the left ventricle.  This has resolved.  No longer on Eliquis.

## 2021-09-13 NOTE — Progress Notes (Signed)
Cardiology Office Note:    Date:  09/13/2021   ID:  Anthony Shepherd, DOB [birth year], MRN [id number]  PCP:  Anthony Funk, MD   [facility] Providers Cardiologist:  Anthony Schultz, MD     Referring MD: Anthony Funk, MD    History of Present Illness:    Anthony Shepherd is a 76 y.o. male here for follow-up of CAD prior mid LAD stent 2019.  Prior LV thrombus noted when EF was 35%.  Take care of his wife Anthony Shepherd as well (soon to have upgrade of ICD by Dr. Ladona Ridgel, also saw Dr. Shirlee Latch and advanced heart failure clinic, on torsemide). 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   Apical thrombus had resolved.  At that time we decided to stop the Eliquis.  Continuing with Plavix and aspirin and statin.  Overall doing quite well.  Denies any chest pain fevers chills nausea vomiting syncope bleeding.  No shortness of breath.   Past Medical History:  Diagnosis Date   HLD (hyperlipidemia)    Ischemic cardiomyopathy    Ischemic heart disease     Past Surgical History:  Procedure Laterality Date   CORONARY STENT INTERVENTION N/A 05/31/2018   Procedure: CORONARY STENT INTERVENTION;  Surgeon: Lyn Records, MD;  Location: [facility] INVASIVE CV LAB;  Service: Cardiovascular;  Laterality: N/A;   LEFT HEART CATH AND CORONARY ANGIOGRAPHY N/A 05/31/2018   Procedure: LEFT HEART CATH AND CORONARY ANGIOGRAPHY;  Surgeon: Lyn Records, MD;  Location: [facility] INVASIVE CV LAB;  Service: Cardiovascular;  Laterality: N/A;   ULTRASOUND GUIDANCE FOR VASCULAR ACCESS  05/31/2018   Procedure: Ultrasound Guidance For Vascular Access;  Surgeon: Lyn Records, MD;  Location: [facility] INVASIVE CV LAB;  Service: Cardiovascular;;    Current Medications: Current Meds  Medication Sig   aspirin EC 81 MG tablet Take 1 tablet (81 mg total) by mouth daily.   atorvastatin (LIPITOR) 80 MG tablet TAKE 1 TABLET AT 6PM.   bimatoprost (LUMIGAN) 0.01 % SOLN Place 1 drop into both eyes at bedtime.   Coenzyme Q10 (CO Q 10 PO) Take 200 mg by  mouth daily.    Cyanocobalamin (VITAMIN B-12 PO) Take 1 tablet by mouth daily.   ELDERBERRY PO Take 50 mg by mouth daily.   hyoscyamine (LEVSIN SL) 0.125 MG SL tablet Place 0.125 mg under the tongue daily as needed for cramping.    losartan (COZAAR) 25 MG tablet TAKE 1 TABLET BY MOUTH DAILY.   metoprolol succinate (TOPROL-XL) 25 MG 24 hr tablet TAKE 1 TABLET EACH DAY.   Multiple Vitamin (MULTIVITAMIN WITH MINERALS) TABS tablet Take 1 tablet by mouth daily.   naphazoline-pheniramine (NAPHCON-A) 0.025-0.3 % ophthalmic solution Place 1 drop into both eyes 5 (five) times daily as needed for eye irritation (dry eyes/seasonal allergies).   nitroGLYCERIN (NITROSTAT) 0.4 MG SL tablet Place 1 tablet (0.4 mg total) under the tongue every 5 (five) minutes x 3 doses as needed for chest pain.     Allergies:   Ace inhibitors   Social History   Socioeconomic History   Marital status: Unknown    Spouse name: Not on file   Number of children: Not on file   Years of education: Not on file   Highest education level: Not on file  Occupational History   Not on file  Tobacco Use   Smoking status: Not on file   Smokeless tobacco: Never  Vaping Use   Vaping Use: Never used  Substance and Sexual  Activity   Alcohol use: Never   Drug use: Never   Sexual activity: Not on file  Other Topics Concern   Not on file  Social History Narrative   ** Merged History Encounter **       Social Determinants of Health   Financial Resource Strain: Not on file  Food Insecurity: Not on file  Transportation Needs: Not on file  Physical Activity: Not on file  Stress: Not on file  Social Connections: Not on file     Family History: The patient's family history includes Colon cancer in his father; Dementia in his mother.  ROS:   Please see the history of present illness.     All other systems reviewed and are negative.  EKGs/Labs/Other Studies Reviewed:    The following studies were reviewed  today:  Cardiac catheterization in 2019 - 95% mid LAD stenosis with Synergy 16 x 3.5 mm stent  Echocardiogram 2019:  - Left ventricle: The cavity size was normal. Wall thickness was    normal. Systolic function was mildly reduced. The estimated    ejection fraction was in the range of 45% to 50%. Diffuse    hypokinesis. Doppler parameters are consistent with abnormal left    ventricular relaxation (grade 1 diastolic dysfunction).   Impressions:   - Definity used; mild global reduction in LV systolic function;    mild diastolic dysfunction; no LV apical thrombus.   EKG:  EKG is  ordered today.  The ekg ordered today demonstrates sinus rhythm 73 no other abnormalities  Recent Labs: No results found for requested labs within last 8760 hours.  Recent Lipid Panel    Component Value Date/Time   CHOL 88 (L) 09/14/2019 0913   TRIG 133 09/14/2019 0913   HDL 31 (L) 09/14/2019 0913   CHOLHDL 2.8 09/14/2019 0913   CHOLHDL 4.3 05/31/2018 0323   VLDL 31 05/31/2018 0323   LDLCALC 33 09/14/2019 0913     Risk Assessment/Calculations:              Physical Exam:    VS:  BP 110/70 (BP Location: Left Arm, Patient Position: Sitting, Cuff Size: Normal)    Pulse 73    Ht 5\' 11"  (1.803 m)    Wt 186 lb (84.4 kg)    SpO2 98%    BMI 25.94 kg/m     Wt Readings from Last 3 Encounters:  09/13/21 186 lb (84.4 kg)  09/14/20 194 lb 9.6 oz (88.3 kg)  09/14/19 192 lb (87.1 kg)     GEN:  Well nourished, well developed in no acute distress HEENT: Normal NECK: No JVD; No carotid bruits LYMPHATICS: No lymphadenopathy CARDIAC: RRR, no murmurs, no rubs, gallops RESPIRATORY:  Clear to auscultation without rales, wheezing or rhonchi  ABDOMEN: Soft, non-tender, non-distended MUSCULOSKELETAL:  No edema; No deformity  SKIN: Warm and dry NEUROLOGIC:  Alert and oriented x 3 PSYCHIATRIC:  Normal affect   ASSESSMENT:    1. Coronary artery disease due to lipid rich plaque   2. Chronic systolic heart  failure (HCC)   3. Pure hypercholesterolemia    PLAN:    In order of problems listed above:  Coronary artery disease due to lipid rich plaque PCI to LAD mid.  Now on aspirin.  Doing well.  No anginal symptoms.  Lab work reviewed as above.  Chronic systolic heart failure (HCC) Currently on both Toprol as well as losartan.  Blood pressure 110/70.  Continue with current plan.  Ejection fraction has improved  as noted above.  Used to be 35, now in the 50% range.  Prior history of thrombus in the left ventricle.  This has resolved.  No longer on Eliquis.  Pure hypercholesterolemia Prior LDL 36, excellent on atorvastatin 80 mg.  LDL goal less than 55.  No myalgias.  Continue to monitor.       Medication Adjustments/Labs and Tests Ordered: Current medicines are reviewed at length with the patient today.  Concerns regarding medicines are outlined above.  Orders Placed This Encounter  Procedures   EKG 12-Lead   No orders of the defined types were placed in this encounter.   Patient Instructions  Medication Instructions:  The current medical regimen is effective;  continue present plan and medications.  *If you need a refill on your cardiac medications before your next appointment, please call your pharmacy*  Follow-Up: At [facility], you and your health needs are our priority.  As part of our continuing mission to provide you with exceptional heart care, we have created designated Provider Care Teams.  These Care Teams include your primary Cardiologist (physician) and Advanced Practice Providers (APPs -  Physician Assistants and Nurse Practitioners) who all work together to provide you with the care you need, when you need it.  We recommend signing up for the patient portal called "MyChart".  Sign up information is provided on this After Visit Summary.  MyChart is used to connect with patients for Virtual Visits (Telemedicine).  Patients are able to view lab/test results, encounter  notes, upcoming appointments, etc.  Non-urgent messages can be sent to your provider as well.   To learn more about what you can do with MyChart, go to [email or web address].    Your next appointment:   1 year(s)  The format for your next appointment:   In Person  Provider:   Donato SchultzMark Jaeden Messer, MD     Thank you for choosing [facility]!!      Signed, Anthony SchultzMark Paulita Licklider, MD  09/13/2021 9:49 AM    [facility]

## 2021-09-13 NOTE — Assessment & Plan Note (Signed)
PCI to LAD mid.  Now on aspirin.  Doing well.  No anginal symptoms.  Lab work reviewed as above.

## 2021-09-13 NOTE — Patient Instructions (Signed)
Medication Instructions:  The current medical regimen is effective;  continue present plan and medications.  *If you need a refill on your cardiac medications before your next appointment, please call your pharmacy*  Follow-Up: At [facility], you and your health needs are our priority.  As part of our continuing mission to provide you with exceptional heart care, we have created designated Provider Care Teams.  These Care Teams include your primary Cardiologist (physician) and Advanced Practice Providers (APPs -  Physician Assistants and Nurse Practitioners) who all work together to provide you with the care you need, when you need it.  We recommend signing up for the patient portal called "MyChart".  Sign up information is provided on this After Visit Summary.  MyChart is used to connect with patients for Virtual Visits (Telemedicine).  Patients are able to view lab/test results, encounter notes, upcoming appointments, etc.  Non-urgent messages can be sent to your provider as well.   To learn more about what you can do with MyChart, go to [email or web address].    Your next appointment:   1 year(s)  The format for your next appointment:   In Person  Provider:   Mark Skains, MD   Thank you for choosing [facility]!!    

## 2021-10-07 DIAGNOSIS — H401121 Primary open-angle glaucoma, left eye, mild stage: Secondary | ICD-10-CM | POA: Diagnosis not present

## 2021-10-07 DIAGNOSIS — H25013 Cortical age-related cataract, bilateral: Secondary | ICD-10-CM | POA: Diagnosis not present

## 2021-10-07 DIAGNOSIS — H2513 Age-related nuclear cataract, bilateral: Secondary | ICD-10-CM | POA: Diagnosis not present

## 2021-10-07 DIAGNOSIS — H401112 Primary open-angle glaucoma, right eye, moderate stage: Secondary | ICD-10-CM | POA: Diagnosis not present

## 2021-11-25 ENCOUNTER — Other Ambulatory Visit: Payer: Self-pay | Admitting: Cardiology

## 2021-11-25 DIAGNOSIS — I5022 Chronic systolic (congestive) heart failure: Secondary | ICD-10-CM

## 2021-11-25 DIAGNOSIS — E782 Mixed hyperlipidemia: Secondary | ICD-10-CM

## 2021-12-09 ENCOUNTER — Other Ambulatory Visit: Payer: Self-pay | Admitting: Cardiology

## 2021-12-09 DIAGNOSIS — E782 Mixed hyperlipidemia: Secondary | ICD-10-CM

## 2021-12-09 DIAGNOSIS — I5022 Chronic systolic (congestive) heart failure: Secondary | ICD-10-CM

## 2022-01-07 DIAGNOSIS — H401112 Primary open-angle glaucoma, right eye, moderate stage: Secondary | ICD-10-CM | POA: Diagnosis not present

## 2022-01-07 DIAGNOSIS — H401121 Primary open-angle glaucoma, left eye, mild stage: Secondary | ICD-10-CM | POA: Diagnosis not present

## 2022-01-15 DIAGNOSIS — L538 Other specified erythematous conditions: Secondary | ICD-10-CM | POA: Diagnosis not present

## 2022-01-15 DIAGNOSIS — D225 Melanocytic nevi of trunk: Secondary | ICD-10-CM | POA: Diagnosis not present

## 2022-01-15 DIAGNOSIS — R208 Other disturbances of skin sensation: Secondary | ICD-10-CM | POA: Diagnosis not present

## 2022-01-15 DIAGNOSIS — L570 Actinic keratosis: Secondary | ICD-10-CM | POA: Diagnosis not present

## 2022-01-15 DIAGNOSIS — L820 Inflamed seborrheic keratosis: Secondary | ICD-10-CM | POA: Diagnosis not present

## 2022-01-15 DIAGNOSIS — Z85828 Personal history of other malignant neoplasm of skin: Secondary | ICD-10-CM | POA: Diagnosis not present

## 2022-01-15 DIAGNOSIS — D2261 Melanocytic nevi of right upper limb, including shoulder: Secondary | ICD-10-CM | POA: Diagnosis not present

## 2022-01-15 DIAGNOSIS — D2262 Melanocytic nevi of left upper limb, including shoulder: Secondary | ICD-10-CM | POA: Diagnosis not present

## 2022-04-10 DIAGNOSIS — Z8546 Personal history of malignant neoplasm of prostate: Secondary | ICD-10-CM | POA: Diagnosis not present

## 2022-04-10 DIAGNOSIS — Z0000 Encounter for general adult medical examination without abnormal findings: Secondary | ICD-10-CM | POA: Diagnosis not present

## 2022-04-10 DIAGNOSIS — Z1331 Encounter for screening for depression: Secondary | ICD-10-CM | POA: Diagnosis not present

## 2022-04-10 DIAGNOSIS — I255 Ischemic cardiomyopathy: Secondary | ICD-10-CM | POA: Diagnosis not present

## 2022-04-10 DIAGNOSIS — E785 Hyperlipidemia, unspecified: Secondary | ICD-10-CM | POA: Diagnosis not present

## 2022-04-10 DIAGNOSIS — I2510 Atherosclerotic heart disease of native coronary artery without angina pectoris: Secondary | ICD-10-CM | POA: Diagnosis not present

## 2022-04-11 DIAGNOSIS — H401121 Primary open-angle glaucoma, left eye, mild stage: Secondary | ICD-10-CM | POA: Diagnosis not present

## 2022-04-11 DIAGNOSIS — H401112 Primary open-angle glaucoma, right eye, moderate stage: Secondary | ICD-10-CM | POA: Diagnosis not present

## 2022-05-27 ENCOUNTER — Other Ambulatory Visit: Payer: Self-pay | Admitting: Cardiology

## 2022-05-27 DIAGNOSIS — E782 Mixed hyperlipidemia: Secondary | ICD-10-CM

## 2022-05-27 DIAGNOSIS — I5022 Chronic systolic (congestive) heart failure: Secondary | ICD-10-CM

## 2022-08-05 DIAGNOSIS — H401121 Primary open-angle glaucoma, left eye, mild stage: Secondary | ICD-10-CM | POA: Diagnosis not present

## 2022-08-05 DIAGNOSIS — H401112 Primary open-angle glaucoma, right eye, moderate stage: Secondary | ICD-10-CM | POA: Diagnosis not present

## 2022-09-15 ENCOUNTER — Ambulatory Visit: Payer: Medicare PPO | Admitting: Cardiology

## 2022-10-21 ENCOUNTER — Ambulatory Visit: Payer: Medicare PPO | Attending: Cardiology | Admitting: Cardiology

## 2022-10-21 ENCOUNTER — Encounter: Payer: Self-pay | Admitting: Cardiology

## 2022-10-21 VITALS — BP 120/70 | HR 79 | Ht 70.0 in | Wt 184.8 lb

## 2022-10-21 DIAGNOSIS — I2510 Atherosclerotic heart disease of native coronary artery without angina pectoris: Secondary | ICD-10-CM

## 2022-10-21 DIAGNOSIS — I5022 Chronic systolic (congestive) heart failure: Secondary | ICD-10-CM

## 2022-10-21 DIAGNOSIS — I2583 Coronary atherosclerosis due to lipid rich plaque: Secondary | ICD-10-CM | POA: Diagnosis not present

## 2022-10-21 DIAGNOSIS — E7800 Pure hypercholesterolemia, unspecified: Secondary | ICD-10-CM

## 2022-10-21 NOTE — Progress Notes (Signed)
Cardiology Office Note:    Date:  10/21/2022   ID:  [name], DOB Aug 02, 1946, MRN [id number]  PCP:  Lavone Orn, MD   [facility] Providers Cardiologist:  Candee Furbish, MD     Referring MD: Lavone Orn, MD    History of Present Illness:    Anthony Shepherd is a 77 y.o. male here for follow-up of CAD prior mid LAD stent 2019.  Prior LV thrombus noted when EF was 35%.  Took care of his wife Anthony Shepherd who unfortunately passed in January 2024 with renal failure, pericardial effusion, heart failure.  Expressed my condolences to he and his family.   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   Apical thrombus had resolved.  At that time we decided to stop the Eliquis.  Continuing with Plavix and aspirin and statin.  Overall doing quite well.  Denies any chest pain fevers chills nausea vomiting syncope bleeding.  No shortness of breath.   Past Medical History:  Diagnosis Date   HLD (hyperlipidemia)    Ischemic cardiomyopathy    Ischemic heart disease     Past Surgical History:  Procedure Laterality Date   CORONARY STENT INTERVENTION N/A 05/31/2018   Procedure: CORONARY STENT INTERVENTION;  Surgeon: Belva Crome, MD;  Location: [place] CV LAB;  Service: Cardiovascular;  Laterality: N/A;   LEFT HEART CATH AND CORONARY ANGIOGRAPHY N/A 05/31/2018   Procedure: LEFT HEART CATH AND CORONARY ANGIOGRAPHY;  Surgeon: Belva Crome, MD;  Location: [place] CV LAB;  Service: Cardiovascular;  Laterality: N/A;   ULTRASOUND GUIDANCE FOR VASCULAR ACCESS  05/31/2018   Procedure: Ultrasound Guidance For Vascular Access;  Surgeon: Belva Crome, MD;  Location: [place] CV LAB;  Service: Cardiovascular;;    Current Medications: Current Meds  Medication Sig   aspirin EC 81 MG tablet Take 1 tablet (81 mg total) by mouth daily.   atorvastatin (LIPITOR) 80 MG tablet TAKE ONE TABLET BY MOUTH AT 6:00 PM   bimatoprost (LUMIGAN) 0.01 % SOLN Place 1 drop into both eyes at bedtime.    Coenzyme Q10 (CO Q 10 PO) Take 200 mg by mouth daily.    Cyanocobalamin (VITAMIN B-12 PO) Take 1 tablet by mouth daily.   ELDERBERRY PO Take 50 mg by mouth daily.   hyoscyamine (LEVSIN SL) 0.125 MG SL tablet Place 0.125 mg under the tongue daily as needed for cramping.    losartan (COZAAR) 25 MG tablet TAKE ONE TABLET BY MOUTH ONCE DAILY   metoprolol succinate (TOPROL-XL) 25 MG 24 hr tablet TAKE ONE TABLET BY MOUTH DAILY   Multiple Vitamin (MULTIVITAMIN WITH MINERALS) TABS tablet Take 1 tablet by mouth daily.   naphazoline-pheniramine (NAPHCON-A) 0.025-0.3 % ophthalmic solution Place 1 drop into both eyes 5 (five) times daily as needed for eye irritation (dry eyes/seasonal allergies).   nitroGLYCERIN (NITROSTAT) 0.4 MG SL tablet Place 1 tablet (0.4 mg total) under the tongue every 5 (five) minutes x 3 doses as needed for chest pain.   Omega-3 Fatty Acids (FISH OIL) 1000 MG CAPS Take 1 capsule by mouth daily.     Allergies:   Ace inhibitors   Social History   Socioeconomic History   Marital status: Widowed    Spouse name: Not on file   Number of children: Not on file   Years of education: Not on file   Highest education level: Not on file  Occupational History   Not on file  Tobacco Use  Smoking status: Never   Smokeless tobacco: Never  Vaping Use   Vaping Use: Never used  Substance and Sexual Activity   Alcohol use: Never   Drug use: Never   Sexual activity: Not on file  Other Topics Concern   Not on file  Social History Narrative   ** Merged History Encounter **       Social Determinants of Health   Financial Resource Strain: Not on file  Food Insecurity: Not on file  Transportation Needs: Not on file  Physical Activity: Not on file  Stress: Not on file  Social Connections: Not on file     Family History: The patient's family history includes Colon cancer in his father; Dementia in his mother.  ROS:   Please see the history of present illness.     All other  systems reviewed and are negative.  EKGs/Labs/Other Studies Reviewed:    The following studies were reviewed today:  Cardiac catheterization in 2019 - 95% mid LAD stenosis with Synergy 16 x 3.5 mm stent  Echocardiogram 2019:  - Left ventricle: The cavity size was normal. Wall thickness was    normal. Systolic function was mildly reduced. The estimated    ejection fraction was in the range of 45% to 50%. Diffuse    hypokinesis. Doppler parameters are consistent with abnormal left    ventricular relaxation (grade 1 diastolic dysfunction).   Impressions:   - Definity used; mild global reduction in LV systolic function;    mild diastolic dysfunction; no LV apical thrombus.   EKG:   10/21/2022: Normal sinus rhythm 79 no other abnormalities. Prior sinus rhythm 73 no other abnormalities  Recent Labs: No results found for requested labs within last 365 days.  Recent Lipid Panel    Component Value Date/Time   CHOL 88 (L) 09/14/2019 0913   TRIG 133 09/14/2019 0913   HDL 31 (L) 09/14/2019 0913   CHOLHDL 2.8 09/14/2019 0913   CHOLHDL 4.3 05/31/2018 0323   VLDL 31 05/31/2018 0323   LDLCALC 33 09/14/2019 0913     Risk Assessment/Calculations:              Physical Exam:    VS:  BP 120/70   Pulse 79   Ht 5' 10"$  (1.778 m)   Wt 184 lb 12.8 oz (83.8 kg)   SpO2 95%   BMI 26.52 kg/m     Wt Readings from Last 3 Encounters:  10/21/22 184 lb 12.8 oz (83.8 kg)  09/13/21 186 lb (84.4 kg)  09/14/20 194 lb 9.6 oz (88.3 kg)     GEN:  Well nourished, well developed in no acute distress HEENT: Normal NECK: No JVD; No carotid bruits LYMPHATICS: No lymphadenopathy CARDIAC: RRR, no murmurs, no rubs, gallops RESPIRATORY:  Clear to auscultation without rales, wheezing or rhonchi  ABDOMEN: Soft, non-tender, non-distended MUSCULOSKELETAL:  No edema; No deformity  SKIN: Warm and dry NEUROLOGIC:  Alert and oriented x 3 PSYCHIATRIC:  Normal affect   ASSESSMENT:    1. Coronary  artery disease due to lipid rich plaque   2. Chronic systolic heart failure (HCC)   3. Pure hypercholesterolemia     PLAN:    In order of problems listed above:   Coronary artery disease due to lipid rich plaque PCI to LAD mid.  Now on aspirin.  Doing well.  No anginal symptoms.  Lab work reviewed as above.  No changes made.  Continue with walking.  Mediterranean diet.  Chronic systolic heart failure ([place])  Currently on both Toprol as well as losartan.  Blood pressure well-controlled.  Continue with current plan.  Ejection fraction has improved as noted above.  Used to be 35, now in the 45 to 50% range in 2019.  Prior history of thrombus in the left ventricle.  This has resolved.  No longer on Eliquis.  Lets go ahead and check an echocardiogram since it has been over 4 years.  Pure hypercholesterolemia Prior LDL 30, excellent on atorvastatin 80 mg.  LDL goal less than 55.  No myalgias.  Continue to monitor.  He is now taking fish oil as well.  Excellent.       Medication Adjustments/Labs and Tests Ordered: Current medicines are reviewed at length with the patient today.  Concerns regarding medicines are outlined above.  Orders Placed This Encounter  Procedures   EKG 12-Lead   ECHOCARDIOGRAM COMPLETE   No orders of the defined types were placed in this encounter.   Patient Instructions  Medication Instructions:  The current medical regimen is effective;  continue present plan and medications.  *If you need a refill on your cardiac medications before your next appointment, please call your pharmacy*  Testing/Procedures: Your physician has requested that you have an echocardiogram. Echocardiography is a painless test that uses sound waves to create images of your heart. It provides your doctor with information about the size and shape of your heart and how well your heart's chambers and valves are working. This procedure takes approximately one hour. There are no restrictions for  this procedure. Please do NOT wear cologne, perfume, aftershave, or lotions (deodorant is allowed). Please arrive 15 minutes prior to your appointment time.  Follow-Up: At [facility], you and your health needs are our priority.  As part of our continuing mission to provide you with exceptional heart care, we have created designated Provider Care Teams.  These Care Teams include your primary Cardiologist (physician) and Advanced Practice Providers (APPs -  Physician Assistants and Nurse Practitioners) who all work together to provide you with the care you need, when you need it.  We recommend signing up for the patient portal called "MyChart".  Sign up information is provided on this After Visit Summary.  MyChart is used to connect with patients for Virtual Visits (Telemedicine).  Patients are able to view lab/test results, encounter notes, upcoming appointments, etc.  Non-urgent messages can be sent to your provider as well.   To learn more about what you can do with MyChart, go to [email or web address].    Your next appointment:   1 year(s)  Provider:   Candee Furbish, MD        Signed, Candee Furbish, MD  10/21/2022 9:37 AM    [place]

## 2022-10-21 NOTE — Patient Instructions (Signed)
Medication Instructions:  The current medical regimen is effective;  continue present plan and medications.  *If you need a refill on your cardiac medications before your next appointment, please call your pharmacy*  Testing/Procedures: Your physician has requested that you have an echocardiogram. Echocardiography is a painless test that uses sound waves to create images of your heart. It provides your doctor with information about the size and shape of your heart and how well your heart's chambers and valves are working. This procedure takes approximately one hour. There are no restrictions for this procedure. Please do NOT wear cologne, perfume, aftershave, or lotions (deodorant is allowed). Please arrive 15 minutes prior to your appointment time.  Follow-Up: At [facility], you and your health needs are our priority.  As part of our continuing mission to provide you with exceptional heart care, we have created designated Provider Care Teams.  These Care Teams include your primary Cardiologist (physician) and Advanced Practice Providers (APPs -  Physician Assistants and Nurse Practitioners) who all work together to provide you with the care you need, when you need it.  We recommend signing up for the patient portal called "MyChart".  Sign up information is provided on this After Visit Summary.  MyChart is used to connect with patients for Virtual Visits (Telemedicine).  Patients are able to view lab/test results, encounter notes, upcoming appointments, etc.  Non-urgent messages can be sent to your provider as well.   To learn more about what you can do with MyChart, go to [email or web address].    Your next appointment:   1 year(s)  Provider:   Candee Furbish, MD

## 2022-11-13 ENCOUNTER — Ambulatory Visit (HOSPITAL_COMMUNITY): Payer: Medicare PPO | Attending: Internal Medicine

## 2022-11-13 DIAGNOSIS — I2583 Coronary atherosclerosis due to lipid rich plaque: Secondary | ICD-10-CM | POA: Insufficient documentation

## 2022-11-13 DIAGNOSIS — I2510 Atherosclerotic heart disease of native coronary artery without angina pectoris: Secondary | ICD-10-CM | POA: Insufficient documentation

## 2022-11-13 DIAGNOSIS — I5022 Chronic systolic (congestive) heart failure: Secondary | ICD-10-CM | POA: Diagnosis not present

## 2022-11-13 LAB — ECHOCARDIOGRAM COMPLETE
Area-P 1/2: 2.92 cm2
S' Lateral: 3.2 cm

## 2022-11-24 ENCOUNTER — Other Ambulatory Visit: Payer: Self-pay | Admitting: Cardiology

## 2022-11-24 DIAGNOSIS — I5022 Chronic systolic (congestive) heart failure: Secondary | ICD-10-CM

## 2022-11-24 DIAGNOSIS — E782 Mixed hyperlipidemia: Secondary | ICD-10-CM

## 2023-01-14 ENCOUNTER — Emergency Department (HOSPITAL_BASED_OUTPATIENT_CLINIC_OR_DEPARTMENT_OTHER)
Admission: EM | Admit: 2023-01-14 | Discharge: 2023-01-14 | Disposition: A | Discharge status: Home / Self Care | Payer: Medicare PPO | Attending: Emergency Medicine | Admitting: Emergency Medicine

## 2023-01-14 ENCOUNTER — Other Ambulatory Visit (HOSPITAL_BASED_OUTPATIENT_CLINIC_OR_DEPARTMENT_OTHER): Payer: Self-pay

## 2023-01-14 ENCOUNTER — Other Ambulatory Visit: Payer: Self-pay

## 2023-01-14 ENCOUNTER — Emergency Department (HOSPITAL_BASED_OUTPATIENT_CLINIC_OR_DEPARTMENT_OTHER): Payer: Medicare PPO | Admitting: Radiology

## 2023-01-14 DIAGNOSIS — R079 Chest pain, unspecified: Secondary | ICD-10-CM | POA: Diagnosis not present

## 2023-01-14 DIAGNOSIS — Z7982 Long term (current) use of aspirin: Secondary | ICD-10-CM | POA: Diagnosis not present

## 2023-01-14 DIAGNOSIS — I2510 Atherosclerotic heart disease of native coronary artery without angina pectoris: Secondary | ICD-10-CM | POA: Diagnosis not present

## 2023-01-14 LAB — BASIC METABOLIC PANEL
Anion gap: 7 (ref 5–15)
BUN: 16 mg/dL (ref 8–23)
CO2: 27 mmol/L (ref 22–32)
Calcium: 8.6 mg/dL — ABNORMAL LOW (ref 8.9–10.3)
Chloride: 106 mmol/L (ref 98–111)
Creatinine, Ser: 0.96 mg/dL (ref 0.61–1.24)
GFR, Estimated: >60 mL/min (ref >60)
Glucose, Bld: 100 mg/dL — ABNORMAL HIGH (ref 70–99)
Potassium: 3.9 mmol/L (ref 3.5–5.1)
Sodium: 140 mmol/L (ref 135–145)

## 2023-01-14 LAB — CBC
HCT: 41.9 % (ref 39.0–52.0)
Hemoglobin: 14.1 g/dL (ref 13.0–17.0)
MCH: 33.3 pg (ref 26.0–34.0)
MCHC: 33.7 g/dL (ref 30.0–36.0)
MCV: 98.8 fL (ref 80.0–100.0)
Platelets: 198 10*3/uL (ref 150–400)
RBC: 4.24 MIL/uL (ref 4.22–5.81)
RDW: 13.2 % (ref 11.5–15.5)
WBC: 9.3 10*3/uL (ref 4.0–10.5)
nRBC: 0 % (ref 0.0–0.2)

## 2023-01-14 LAB — HEPATIC FUNCTION PANEL
ALT: 23 U/L (ref 0–44)
AST: 24 U/L (ref 15–41)
Albumin: 3.6 g/dL (ref 3.5–5.0)
Alkaline Phosphatase: 32 U/L — ABNORMAL LOW (ref 38–126)
Bilirubin, Direct: 0.4 mg/dL — ABNORMAL HIGH (ref 0.0–0.2)
Indirect Bilirubin: 1.5 mg/dL — ABNORMAL HIGH (ref 0.3–0.9)
Total Bilirubin: 1.9 mg/dL — ABNORMAL HIGH (ref 0.3–1.2)
Total Protein: 6.7 g/dL (ref 6.5–8.1)

## 2023-01-14 LAB — LIPASE, BLOOD: Lipase: 56 U/L — ABNORMAL HIGH (ref 11–51)

## 2023-01-14 LAB — TROPONIN I (HIGH SENSITIVITY)
Troponin I (High Sensitivity): 6 ng/L (ref <18)
Troponin I (High Sensitivity): 5 ng/L (ref <18)

## 2023-01-14 MED ORDER — PANTOPRAZOLE SODIUM 20 MG PO TBEC
20.0000 mg | DELAYED_RELEASE_TABLET | Freq: Every day | ORAL | 0 refills | Status: DC
  Filled 2023-01-14: qty 30, 30d supply, fill #0

## 2023-01-14 MED ORDER — PANTOPRAZOLE SODIUM 40 MG IV SOLR
40.0000 mg | Freq: Once | INTRAVENOUS | Status: AC
  Administered 2023-01-14: 40 mg via INTRAVENOUS
  Filled 2023-01-14: qty 10

## 2023-01-14 MED ORDER — FENTANYL CITRATE PF 50 MCG/ML IJ SOSY: 50.0000 ug | PREFILLED_SYRINGE | Freq: Once | INTRAMUSCULAR | Status: DC

## 2023-01-14 MED ORDER — ASPIRIN 81 MG PO CHEW
324.0000 mg | CHEWABLE_TABLET | Freq: Once | ORAL | Status: AC
  Administered 2023-01-14: 324 mg via ORAL
  Filled 2023-01-14: qty 4

## 2023-01-14 NOTE — ED Triage Notes (Signed)
Pt arrives ambulatory to treatment room reporting onset of left sided chest pain that feels somewhat like indigestion since yesterday. Denies SOB, or radiation of pain. No meds for pain. Cardiac history.

## 2023-01-14 NOTE — ED Provider Notes (Signed)
[place] EMERGENCY DEPARTMENT AT [facility] Provider Note   CSN: [id number] Arrival date & time: 01/14/23  4098     History  Chief Complaint  Patient presents with   Chest Pain    Anthony Shepherd is a 77 y.o. male.  HPI Patient reports he developed some chest pain yesterday evening.  He thought was possibly due to having eaten some soup with horseradish at lunchtime.  He reports he did have a sensation of bloating and is a lot of belching.  Pain persisted and was also somewhat exacerbated by rolling from side-to-side.  He felt like it moved somewhat.  Mostly it has been in the left upper chest.  Full and achy in quality.  No radiation into the neck or the arm.  No associated nausea, diaphoresis or lightheadedness.  No associated shortness of breath.  Patient reports that he had a heart attack in 2019 and symptoms were more of a GI fullness type quality.  However at that time he reports he also had nausea and lightheadedness.  Patient reports has been doing usual activities without chest pain.  He has been out working in the yard and doing physical activity without exertional shortness of breath or lightheadedness.    Home Medications Prior to Admission medications   Medication Sig Start Date End Date Taking? Authorizing Provider  aspirin EC 81 MG tablet Take 1 tablet (81 mg total) by mouth daily. 09/13/18  Yes Jake Bathe, MD  atorvastatin (LIPITOR) 80 MG tablet TAKE ONE TABLET BY MOUTH AT 6pm 11/24/22  Yes Skains, Veverly Fells, MD  bimatoprost (LUMIGAN) 0.01 % SOLN Place 1 drop into both eyes at bedtime.   Yes [provider]  Coenzyme Q10 (CO Q 10 PO) Take 200 mg by mouth daily.    Yes [provider]  Cyanocobalamin (VITAMIN B-12 PO) Take 1 tablet by mouth daily.   Yes [provider]  ELDERBERRY PO Take 50 mg by mouth daily.   Yes [provider]  losartan (COZAAR) 25 MG tablet TAKE ONE TABLET BY MOUTH ONCE DAILY 11/24/22  Yes Jake Bathe,  MD  metoprolol succinate (TOPROL-XL) 25 MG 24 hr tablet TAKE ONE TABLET BY MOUTH DAILY 11/24/22  Yes Jake Bathe, MD  Multiple Vitamin (MULTIVITAMIN WITH MINERALS) TABS tablet Take 1 tablet by mouth daily.   Yes [provider]  naphazoline-pheniramine (NAPHCON-A) 0.025-0.3 % ophthalmic solution Place 1 drop into both eyes 5 (five) times daily as needed for eye irritation (dry eyes/seasonal allergies).   Yes [provider]  Omega-3 Fatty Acids (FISH OIL) 1000 MG CAPS Take 1 capsule by mouth daily.   Yes [provider]  pantoprazole (PROTONIX) 20 MG tablet Take 1 tablet (20 mg total) by mouth daily. 01/14/23  Yes Mase Dhondt, Lebron Conners, MD  hyoscyamine (LEVSIN SL) 0.125 MG SL tablet Place 0.125 mg under the tongue daily as needed for cramping.  02/22/18   [provider]  nitroGLYCERIN (NITROSTAT) 0.4 MG SL tablet Place 1 tablet (0.4 mg total) under the tongue every 5 (five) minutes x 3 doses as needed for chest pain. 06/01/18   Kroeger, Ovidio Kin., PA-C      Allergies    Ace inhibitors    Review of Systems   Review of Systems  Physical Exam Updated Vital Signs BP 121/76   Pulse 60   Temp 97.7 F (36.5 C) (Oral)   Resp 16   SpO2 98%  Physical Exam Constitutional:  General: He is not in acute distress.    Appearance: Normal appearance.  HENT:     Mouth/Throat:     Pharynx: Oropharynx is clear.  Eyes:     Extraocular Movements: Extraocular movements intact.  Cardiovascular:     Rate and Rhythm: Normal rate and regular rhythm.  Pulmonary:     Effort: Pulmonary effort is normal.     Breath sounds: Normal breath sounds.  Chest:     Chest wall: No tenderness.  Abdominal:     General: There is no distension.     Palpations: Abdomen is soft.     Tenderness: There is no abdominal tenderness. There is no guarding.  Musculoskeletal:        General: No swelling or tenderness. Normal range of motion.     Right lower leg: No edema.     Left lower leg:  No edema.  Skin:    General: Skin is warm and dry.  Neurological:     General: No focal deficit present.     Mental Status: He is oriented to person, place, and time.     Motor: No weakness.     Coordination: Coordination normal.  Psychiatric:        Mood and Affect: Mood normal.     ED Results / Procedures / Treatments   Labs (all labs ordered are listed, but only abnormal results are displayed) Labs Reviewed  BASIC METABOLIC PANEL - Abnormal; Notable for the following components:      Result Value   Glucose, Bld 100 (*)    Calcium 8.6 (*)    All other components within normal limits  HEPATIC FUNCTION PANEL - Abnormal; Notable for the following components:   Alkaline Phosphatase 32 (*)    Total Bilirubin 1.9 (*)    Bilirubin, Direct 0.4 (*)    Indirect Bilirubin 1.5 (*)    All other components within normal limits  LIPASE, BLOOD - Abnormal; Notable for the following components:   Lipase 56 (*)    All other components within normal limits  CBC  TROPONIN I (HIGH SENSITIVITY)  TROPONIN I (HIGH SENSITIVITY)    EKG EKG Interpretation  Date/Time:  Wednesday Jan 14 2023 06:59:15 EDT Ventricular Rate:  65 PR Interval:  183 QRS Duration: 98 QT Interval:  395 QTC Calculation: 411 R Axis:   -5 Text Interpretation: Sinus rhythm normalization of previous lateral t wave abnormality Confirmed by Arby Barrette [phone]) on 01/14/2023 7:02:07 AM  Radiology DG Chest 2 View  Result Date: 01/14/2023 CLINICAL DATA:  Chest pain EXAM: CHEST - 2 VIEW COMPARISON:  05/30/2018 FINDINGS: Normal heart size and mediastinal contours. No acute infiltrate or edema. No effusion or pneumothorax. No acute osseous findings. Artifact from EKG leads. IMPRESSION: Stable and negative chest. Electronically Signed   By: Tiburcio Pea M.D.   On: 01/14/2023 07:38    Procedures Procedures    Medications Ordered in ED Medications  fentaNYL (SUBLIMAZE) injection 50 mcg (50 mcg Intravenous Patient  Refused/Not Given 01/14/23 0754)  pantoprazole (PROTONIX) injection 40 mg (40 mg Intravenous Given 01/14/23 0758)  aspirin chewable tablet 324 mg (324 mg Oral Given 01/14/23 0758)    ED Course/ Medical Decision Making/ A&P                             Medical Decision Making Amount and/or Complexity of Data Reviewed Labs: ordered. Radiology: ordered.  Risk OTC drugs. Prescription drug management.  Patient has history of coronary artery disease with stent.  He experienced chest pain starting yesterday evening.  Clinically well in appearance at this time.  Vital signs are stable.  EKG reviewed by myself no ischemic appearance normal in appearance.  As compared to prior EKG which had anterior lateral T wave abnormality.  Will proceed with cardiac workup and chest x-ray.  Differential diagnosis includes PE\ACS\GERD\esophageal spasm\musculoskeletal pain\pancreatitis.  This time I have low suspicion for PE with no lower extremity swelling or calf pain, normal vital signs and low risk.  Will administer empiric dose of Protonix for possible GI related discomfort and proceed with diagnostic evaluation.  EKG with no ischemic changes and troponins are normal.  At this time was otherwise normal diagnostic evaluation and patient in very good condition with normal vital signs will plan for continued outpatient management.  Will trial Protonix to see if symptoms are improved as there is a quality of GI symptoms associated with this presentation.  I reviewed with the patient a very low threshold for returning if he gets any symptoms of nausea, lightheadedness, radiation or changing quality of pain.  Patient will schedule for an expeditious follow-up with cardiology.        Final Clinical Impression(s) / ED Diagnoses Final diagnoses:  Chest pain, unspecified type    Rx / DC Orders ED Discharge Orders          Ordered    pantoprazole (PROTONIX) 20 MG tablet  Daily        01/14/23 1153               Arby Barrette, MD 01/14/23 1202

## 2023-01-14 NOTE — Discharge Instructions (Addendum)
1.  Continue your daily aspirin. 2.  Take Protonix in the morning about 30 minutes before you eat or drink anything.  Review the information about gastroesophageal reflux disease.  Follow these instructions and see if this helps her symptoms. 3.  Make an appointment to see Dr. Anne Fu for recheck as soon as possible.  Return to the emergency department immediately if you are getting recurrence of pain and any other symptoms such as nausea, lightheadedness, sweatiness, weakness or radiation of pain.

## 2023-01-14 NOTE — ED Notes (Signed)
Pt discharged home and given discharge paperwork with prescriptions. Opportunities given for questions. Pt verbalizes understanding. PIV removed x1. Jillyn Hidden , RN

## 2023-01-16 ENCOUNTER — Telehealth: Payer: Self-pay | Admitting: *Deleted

## 2023-01-16 DIAGNOSIS — I214 Non-ST elevation (NSTEMI) myocardial infarction: Secondary | ICD-10-CM

## 2023-01-16 DIAGNOSIS — I5022 Chronic systolic (congestive) heart failure: Secondary | ICD-10-CM

## 2023-01-16 DIAGNOSIS — I249 Acute ischemic heart disease, unspecified: Secondary | ICD-10-CM

## 2023-01-16 DIAGNOSIS — I2510 Atherosclerotic heart disease of native coronary artery without angina pectoris: Secondary | ICD-10-CM

## 2023-01-16 NOTE — Telephone Encounter (Signed)
Attempted to contact pt to discuss order for myoview stress test per Dr Anne Fu.  Left message to call back to discuss instructions and verify that he is able to walk the treadmill - if not he will need a lexiscan. Pt takes Metoprolol 25 mg a day which he will need to hold for myoview testing.

## 2023-01-16 NOTE — Telephone Encounter (Signed)
Please order NUC stress test Thanks Donato Schultz, MD       Previous Messages    ----- Message ----- From: Arby Barrette, MD Sent: 01/14/2023  12:03 PM EDT To: Jake Bathe, MD  Hi. Patient in the [facility] emergency department.  He had nonspecific chest pain without associated symptoms and negative workup.  He does have previously high risk CAD and I recommended he contact your office for follow-up ASAP. Thanks, Arby Barrette

## 2023-01-19 NOTE — Telephone Encounter (Signed)
Patient was returning call. Please advise ?

## 2023-01-19 NOTE — Telephone Encounter (Signed)
Spoke with the patient and advised on recommendations from Dr. Anne Fu. The patient is able to walk on a treadmill. Test has been ordered and instructions placed in MyChart.

## 2023-01-20 ENCOUNTER — Ambulatory Visit (HOSPITAL_COMMUNITY): Payer: Medicare PPO | Attending: Cardiology

## 2023-01-20 DIAGNOSIS — I5022 Chronic systolic (congestive) heart failure: Secondary | ICD-10-CM

## 2023-01-20 DIAGNOSIS — I249 Acute ischemic heart disease, unspecified: Secondary | ICD-10-CM | POA: Diagnosis present

## 2023-01-20 DIAGNOSIS — I214 Non-ST elevation (NSTEMI) myocardial infarction: Secondary | ICD-10-CM

## 2023-01-20 DIAGNOSIS — I2583 Coronary atherosclerosis due to lipid rich plaque: Secondary | ICD-10-CM | POA: Diagnosis present

## 2023-01-20 DIAGNOSIS — I2510 Atherosclerotic heart disease of native coronary artery without angina pectoris: Secondary | ICD-10-CM

## 2023-01-20 LAB — MYOCARDIAL PERFUSION IMAGING
Estimated workload: 7
Exercise duration (min): 6 min
Exercise duration (sec): 16 s
LV dias vol: 89 mL (ref 62–150)
LV sys vol: 39 mL
MPHR: 144 {beats}/min
Nuc Stress EF: 57 %
Peak HR: 136 {beats}/min
Percent HR: 94 %
Rest HR: 59 {beats}/min
Rest Nuclear Isotope Dose: 10.3 mCi
SDS: 0
SRS: 0
SSS: 0
ST Depression (mm): 0 mm
Stress Nuclear Isotope Dose: 31.2 mCi
TID: 0.92

## 2023-01-20 MED ORDER — TECHNETIUM TC 99M TETROFOSMIN IV KIT
10.3000 | PACK | Freq: Once | INTRAVENOUS | Status: AC | PRN
  Administered 2023-01-20: 10.3 via INTRAVENOUS

## 2023-01-20 MED ORDER — TECHNETIUM TC 99M TETROFOSMIN IV KIT
31.2000 | PACK | Freq: Once | INTRAVENOUS | Status: AC | PRN
  Administered 2023-01-20: 31.2 via INTRAVENOUS

## 2023-02-06 ENCOUNTER — Other Ambulatory Visit (HOSPITAL_BASED_OUTPATIENT_CLINIC_OR_DEPARTMENT_OTHER): Payer: Self-pay

## 2023-05-22 ENCOUNTER — Other Ambulatory Visit: Payer: Self-pay | Admitting: Cardiology

## 2023-05-22 DIAGNOSIS — I5022 Chronic systolic (congestive) heart failure: Secondary | ICD-10-CM

## 2023-05-22 DIAGNOSIS — E782 Mixed hyperlipidemia: Secondary | ICD-10-CM

## 2023-08-17 DIAGNOSIS — H401121 Primary open-angle glaucoma, left eye, mild stage: Secondary | ICD-10-CM | POA: Diagnosis not present

## 2023-10-07 DIAGNOSIS — D225 Melanocytic nevi of trunk: Secondary | ICD-10-CM | POA: Diagnosis not present

## 2023-10-07 DIAGNOSIS — D2262 Melanocytic nevi of left upper limb, including shoulder: Secondary | ICD-10-CM | POA: Diagnosis not present

## 2023-10-07 DIAGNOSIS — D2261 Melanocytic nevi of right upper limb, including shoulder: Secondary | ICD-10-CM | POA: Diagnosis not present

## 2023-10-07 DIAGNOSIS — L570 Actinic keratosis: Secondary | ICD-10-CM | POA: Diagnosis not present

## 2023-10-07 DIAGNOSIS — Z85828 Personal history of other malignant neoplasm of skin: Secondary | ICD-10-CM | POA: Diagnosis not present

## 2023-10-07 DIAGNOSIS — D2272 Melanocytic nevi of left lower limb, including hip: Secondary | ICD-10-CM | POA: Diagnosis not present

## 2023-11-12 DIAGNOSIS — H401121 Primary open-angle glaucoma, left eye, mild stage: Secondary | ICD-10-CM | POA: Diagnosis not present

## 2023-11-26 DIAGNOSIS — R509 Fever, unspecified: Secondary | ICD-10-CM | POA: Diagnosis not present

## 2023-11-26 DIAGNOSIS — R051 Acute cough: Secondary | ICD-10-CM | POA: Diagnosis not present

## 2023-11-26 DIAGNOSIS — B349 Viral infection, unspecified: Secondary | ICD-10-CM | POA: Diagnosis not present

## 2023-11-26 DIAGNOSIS — J101 Influenza due to other identified influenza virus with other respiratory manifestations: Secondary | ICD-10-CM | POA: Diagnosis not present

## 2023-12-02 ENCOUNTER — Encounter: Payer: Self-pay | Admitting: Cardiology

## 2023-12-02 ENCOUNTER — Ambulatory Visit: Payer: Medicare PPO | Attending: Cardiology | Admitting: Cardiology

## 2023-12-02 VITALS — BP 112/74 | HR 71 | Ht 70.0 in | Wt 188.0 lb

## 2023-12-02 DIAGNOSIS — E7800 Pure hypercholesterolemia, unspecified: Secondary | ICD-10-CM

## 2023-12-02 DIAGNOSIS — I5022 Chronic systolic (congestive) heart failure: Secondary | ICD-10-CM | POA: Diagnosis not present

## 2023-12-02 DIAGNOSIS — I2583 Coronary atherosclerosis due to lipid rich plaque: Secondary | ICD-10-CM | POA: Diagnosis not present

## 2023-12-02 DIAGNOSIS — I2510 Atherosclerotic heart disease of native coronary artery without angina pectoris: Secondary | ICD-10-CM

## 2023-12-02 MED ORDER — NITROGLYCERIN 0.4 MG SL SUBL: 0.4000 mg | SUBLINGUAL_TABLET | SUBLINGUAL | 4 refills | Status: AC | PRN

## 2023-12-02 NOTE — Patient Instructions (Addendum)
 Medication Instructions:   NITROGLYCERIN 0.4 MG SUBLINGUAL (UNDER THE TONGUE)--PLACE ONE TABLET UNDER THE TONGUE EVERY 5 MINS AS NEEDED FOR CHEST PAIN--IF YOU ARE GETTING TO TABLET #3 WITH NO CHEST PAIN RELIEF, PLEASE CALL 911  *If you need a refill on your cardiac medications before your next appointment, please call your pharmacy*   Follow-Up: At [facility], you and your health needs are our priority.  As part of our continuing mission to provide you with exceptional heart care, we have created designated Provider Care Teams.  These Care Teams include your primary Cardiologist (physician) and Advanced Practice Providers (APPs -  Physician Assistants and Nurse Practitioners) who all work together to provide you with the care you need, when you need it.  We recommend signing up for the patient portal called "MyChart".  Sign up information is provided on this After Visit Summary.  MyChart is used to connect with patients for Virtual Visits (Telemedicine).  Patients are able to view lab/test results, encounter notes, upcoming appointments, etc.  Non-urgent messages can be sent to your provider as well.   To learn more about what you can do with MyChart, go to [email or web address].    Your next appointment:   1 year(s)  Provider:   Jari Favre, PA-C, Ronie Spies, PA-C, Robin Searing, NP, Jacolyn Reedy, PA-C, Eligha Bridegroom, NP, Tereso Newcomer, PA-C, or Perlie Gold, PA-C

## 2023-12-02 NOTE — Progress Notes (Signed)
 Cardiology Office Note:  .   Date:  12/02/2023  ID:  [name], DOB [id number], MRN [id number] PCP: Thana Ates, MD  [facility] Providers Cardiologist:  Donato Schultz, MD     History of Present Illness: .   Anthony Shepherd is a 78 y.o. male Discussed the use of AI scribe software for clinical note transcription with the patient, who gave verbal consent to proceed.  History of Present Illness Anthony Shepherd "Anthony Shepherd" is a 78 year old male with coronary artery disease who presents for follow-up.  He has a history of coronary artery disease with a prior stent placed in the mid LAD in 2019. In May 2024, he experienced symptoms similar to indigestion and was evaluated in the ER. Troponins were normal, and a stress test on Jan 20, 2023, showed good blood flow through the artery with the stent. He reports no current chest pain or shortness of breath. He continues to take aspirin 81 mg daily, atorvastatin 80 mg daily, losartan 25 mg daily, and metoprolol XL 25 mg daily. His LDL is 40.  He has a history of systolic heart failure with an ejection fraction of 35% and a resolved apical thrombus. He was previously on Eliquis, which has been discontinued. He feels well overall without any bleeding issues. His creatinine is 0.9, triglycerides are 136, and hemoglobin is 13.9.  He recently had influenza A, which he contracted from his son-in-law Anthony Shepherd's family. He mentions experiencing allergies this week, particularly nasal symptoms.  He lives alone but his daughter and son-in-law live next door and look after him. He remains active, doing his own yard work, and plans to resume walking regularly. He follows a Mediterranean diet and avoids processed foods.      ROS: No further CP  Studies Reviewed: Marland Kitchen   EKG Interpretation Date/Time:  Wednesday December 02 2023 09:22:34 EDT Ventricular Rate:  71 PR Interval:  174 QRS Duration:  88 QT Interval:  370 QTC Calculation: 402 R Axis:   -38  Text  Interpretation: Normal sinus rhythm Left axis deviation When compared with ECG of 14-Jan-2023 06:59, No significant change since last tracing Confirmed by Donato Schultz ([id number]) on 12/02/2023 9:29:56 AM    Results LABS LDL: 40 Creatinine: 0.9 Triglycerides: 136 Hemoglobin: 13.9 Troponins: Normal (2024) Influenza A: Positive (2024)  DIAGNOSTIC Cardiac catheterization: 95% mid LAD stenosis with synergy 16x3.5 mm stent, EF 45-50%, resolved apical thrombus (2019) Stress test: Normal blood flow through the LAD with prior stent (01/20/2023) Risk Assessment/Calculations:            Physical Exam:   VS:  BP 112/74   Pulse 71   Ht 5\' 10"  (1.778 m)   Wt 188 lb (85.3 kg)   SpO2 96%   BMI 26.98 kg/m    Wt Readings from Last 3 Encounters:  12/02/23 188 lb (85.3 kg)  10/21/22 184 lb 12.8 oz (83.8 kg)  09/13/21 186 lb (84.4 kg)    GEN: Well nourished, well developed in no acute distress NECK: No JVD; No carotid bruits CARDIAC: RRR, no murmurs, no rubs, no gallops RESPIRATORY:  Clear to auscultation without rales, wheezing or rhonchi  ABDOMEN: Soft, non-tender, non-distended EXTREMITIES:  No edema; No deformity   ASSESSMENT AND PLAN: .    Assessment and Plan Assessment & Plan Coronary artery disease Coronary artery disease with prior stent placement in the mid LAD in 2019. Recent stress test in May 2024 showed good perfusion through the stented artery. Currently  on atorvastatin, losartan, and metoprolol, with an LDL level of 40, indicating effective lipid control. Reports no angina or dyspnea. Discussed refilling nitroglycerin for emergency use, though unused in years. Emphasized maintaining a Mediterranean diet and regular exercise. - Continue atorvastatin 80 mg daily - Continue losartan 25 mg daily - Continue metoprolol succinate 25 mg daily - Refill nitroglycerin prescription for emergency use - Encourage Mediterranean diet and regular exercise -Refill NTG  Chronic systolic heart  failure Chronic systolic heart failure with an ejection fraction of 35%. Well-managed with no recent decompensation. Previously on Eliquis for an apical thrombus, now resolved, and Eliquis was discontinued. Continues to do well on current heart failure management regimen. - Continue current heart failure management regimen  Hyperlipidemia Hyperlipidemia is well-controlled with atorvastatin, as evidenced by an LDL level of 40. Adherent to the medication regimen. - Continue atorvastatin 80 mg daily  Follow-up Stable cardiac status with current management. Discussed minimal finding of a 40 mm ascending aorta on the last stress test, not requiring immediate follow-up. Plan to consider echocardiogram if clinically indicated. - Schedule follow-up visit in one year with an advanced practice provider (APP) - Instruct to reach out if any changes in symptoms occur - Consider echocardiogram if clinically indicated           Signed, Donato Schultz, MD

## 2023-12-04 ENCOUNTER — Other Ambulatory Visit: Payer: Self-pay | Admitting: Cardiology

## 2023-12-04 DIAGNOSIS — I5022 Chronic systolic (congestive) heart failure: Secondary | ICD-10-CM

## 2023-12-04 DIAGNOSIS — E782 Mixed hyperlipidemia: Secondary | ICD-10-CM

## 2024-01-30 ENCOUNTER — Other Ambulatory Visit: Payer: Self-pay

## 2024-01-30 ENCOUNTER — Emergency Department (HOSPITAL_BASED_OUTPATIENT_CLINIC_OR_DEPARTMENT_OTHER)
Admission: EM | Admit: 2024-01-30 | Discharge: 2024-01-31 | Disposition: A | Discharge status: Home / Self Care | Attending: Emergency Medicine | Admitting: Emergency Medicine

## 2024-01-30 DIAGNOSIS — S59912A Unspecified injury of left forearm, initial encounter: Secondary | ICD-10-CM | POA: Diagnosis present

## 2024-01-30 DIAGNOSIS — Z7982 Long term (current) use of aspirin: Secondary | ICD-10-CM | POA: Diagnosis not present

## 2024-01-30 DIAGNOSIS — S5012XA Contusion of left forearm, initial encounter: Secondary | ICD-10-CM | POA: Diagnosis not present

## 2024-01-30 DIAGNOSIS — W01198A Fall on same level from slipping, tripping and stumbling with subsequent striking against other object, initial encounter: Secondary | ICD-10-CM | POA: Insufficient documentation

## 2024-01-30 NOTE — ED Triage Notes (Signed)
 Pt POV reporting hematoma to L arm after tripping and catching himself on brick wall. Denies arm pain, ambulatory.

## 2024-01-31 ENCOUNTER — Emergency Department (HOSPITAL_BASED_OUTPATIENT_CLINIC_OR_DEPARTMENT_OTHER)

## 2024-01-31 ENCOUNTER — Other Ambulatory Visit (HOSPITAL_BASED_OUTPATIENT_CLINIC_OR_DEPARTMENT_OTHER)

## 2024-01-31 DIAGNOSIS — Z043 Encounter for examination and observation following other accident: Secondary | ICD-10-CM | POA: Diagnosis not present

## 2024-01-31 NOTE — Discharge Instructions (Signed)
 You were seen today and had an injury to the left forearm.  This appears to be bruising.  X-rays are negative for fracture.  Apply ice.  Take Tylenol  as needed for pain.

## 2024-01-31 NOTE — ED Provider Notes (Signed)
 [place] EMERGENCY DEPARTMENT AT [facility] Provider Note   CSN: [id number] Arrival date & time: 01/30/24  2049     History  Chief Complaint  Patient presents with   Anthony Shepherd is a 78 y.o. male.  HPI     This is a 78 year old male who presents with left forearm pain.  Patient reports that he tripped and fell and hit his left forearm on a brick wall.  He did not hit his head or lose consciousness.  Denies other injury.  He is not on any blood thinners.  He noted bruising and was concerned that it might be more than a contusion.  Denies weakness or numbness in the hand.  Home Medications Prior to Admission medications   Medication Sig Start Date End Date Taking? Authorizing Provider  aspirin  EC 81 MG tablet Take 1 tablet (81 mg total) by mouth daily. 09/13/18   Hugh Madura, MD  atorvastatin  (LIPITOR ) 80 MG tablet Take 1 tablet (80 mg total) by mouth daily at 6 PM. 12/04/23   Hugh Madura, MD  b complex vitamins capsule Take 1 capsule by mouth daily.    [provider]  bimatoprost (LUMIGAN) 0.01 % SOLN Place 1 drop into both eyes at bedtime.    [provider]  Coenzyme Q10 (CO Q 10 PO) Take 200 mg by mouth daily.     [provider]  ELDERBERRY PO Take 50 mg by mouth daily.    [provider]  losartan  (COZAAR ) 25 MG tablet TAKE ONE TABLET BY MOUTH ONCE DAILY 12/04/23   Hugh Madura, MD  metoprolol  succinate (TOPROL -XL) 25 MG 24 hr tablet TAKE ONE TABLET BY MOUTH DAILY 12/04/23   Hugh Madura, MD  Multiple Vitamin (MULTIVITAMIN WITH MINERALS) TABS tablet Take 1 tablet by mouth daily.    [provider]  naphazoline-pheniramine (NAPHCON-A) 0.025-0.3 % ophthalmic solution Place 1 drop into both eyes 5 (five) times daily as needed for eye irritation (dry eyes/seasonal allergies).    [provider]  nitroGLYCERIN  (NITROSTAT ) 0.4 MG SL tablet Place 1 tablet (0.4 mg total) under the tongue every 5 (five)  minutes as needed for chest pain. 12/02/23   Hugh Madura, MD  Omega-3 Fatty Acids (FISH OIL) 1000 MG CAPS Take 1 capsule by mouth daily.    [provider]      Allergies    Ace inhibitors    Review of Systems   Review of Systems  Musculoskeletal:        Left forearm pain  Skin:  Positive for color change.  All other systems reviewed and are negative.   Physical Exam Updated Vital Signs BP 121/62   Pulse 77   Temp 97.8 F (36.6 C) (Oral)   Resp 18   Ht 1.778 m (5\' 10" )   Wt 83.9 kg   SpO2 95%   BMI 26.54 kg/m  Physical Exam Vitals and nursing note reviewed.  Constitutional:      Appearance: He is well-developed. He is not ill-appearing.  HENT:     Head: Normocephalic and atraumatic.  Eyes:     Pupils: Pupils are equal, round, and reactive to light.  Cardiovascular:     Rate and Rhythm: Normal rate and regular rhythm.     Heart sounds: Normal heart sounds. No murmur heard. Pulmonary:     Effort: Pulmonary effort is normal. No respiratory distress.     Breath sounds: Normal breath sounds.  No wheezing.  Abdominal:     Palpations: Abdomen is soft.  Musculoskeletal:     Cervical back: Neck supple.     Comments: Tenderness to palpation left mid forearm, no obvious deformity, overlying contusion noted, 2+ radial pulse, neurovascularly intact distally  Lymphadenopathy:     Cervical: No cervical adenopathy.  Skin:    General: Skin is warm and dry.  Neurological:     Mental Status: He is alert and oriented to person, place, and time.  Psychiatric:        Mood and Affect: Mood normal.     ED Results / Procedures / Treatments   Labs (all labs ordered are listed, but only abnormal results are displayed) Labs Reviewed - No data to display  EKG None  Radiology DG Forearm Left Result Date: 01/31/2024 CLINICAL DATA:  Fall. EXAM: LEFT FOREARM - 2 VIEW COMPARISON:  None Available. FINDINGS: There is no evidence of fracture or other focal bone lesions. Soft  tissues are unremarkable. There is a small olecranon spur. IMPRESSION: Negative. Electronically Signed   By: Sydell Eva M.D.   On: 01/31/2024 00:17    Procedures Procedures    Medications Ordered in ED Medications - No data to display  ED Course/ Medical Decision Making/ A&P                                 Medical Decision Making Amount and/or Complexity of Data Reviewed Radiology: ordered.   This patient presents to the ED for concern of arm pain, this involves an extensive number of treatment options, and is a complaint that carries with it a high risk of complications and morbidity.  I considered the following differential and admission for this acute, potentially life threatening condition.  The differential diagnosis includes fracture, contusion  MDM:    This is a 78 year old male who presents with left arm pain.  He is nontoxic and vital signs are reassuring.  He has some bruising to the left forearm without deformity.  X-rays do not show any evidence of fracture.  Compartments are soft and he is neurovascularly intact.  Suspect that this is just a contusion.  Recommend ice and Tylenol .  (Labs, imaging, consults)  Labs: I Ordered, and personally interpreted labs.  The pertinent results include: None  Imaging Studies ordered: I ordered imaging studies including x-ray I independently visualized and interpreted imaging. I agree with the radiologist interpretation  Additional history obtained from chart review.  External records from outside source obtained and reviewed including prior evaluations  Cardiac Monitoring: The patient was not maintained on a cardiac monitor.  If on the cardiac monitor, I personally viewed and interpreted the cardiac monitored which showed an underlying rhythm of: N/A  Reevaluation: After the interventions noted above, I reevaluated the patient and found that they have :stayed the same  Social Determinants of Health:  lives  independently  Disposition: Discharge  Co morbidities that complicate the patient evaluation  Past Medical History:  Diagnosis Date   HLD (hyperlipidemia)    Ischemic cardiomyopathy    Ischemic heart disease      Medicines No orders of the defined types were placed in this encounter.   I have reviewed the patients home medicines and have made adjustments as needed  Problem List / ED Course: Problem List Items Addressed This Visit   None Visit Diagnoses       Traumatic hematoma of left forearm, initial  encounter    -  Primary                   Final Clinical Impression(s) / ED Diagnoses Final diagnoses:  Traumatic hematoma of left forearm, initial encounter    Rx / DC Orders ED Discharge Orders     None         Chenille Toor, Vonzella Guernsey, MD 01/31/24 0028

## 2024-02-15 DIAGNOSIS — H401112 Primary open-angle glaucoma, right eye, moderate stage: Secondary | ICD-10-CM | POA: Diagnosis not present

## 2024-02-15 DIAGNOSIS — H25013 Cortical age-related cataract, bilateral: Secondary | ICD-10-CM | POA: Diagnosis not present

## 2024-02-15 DIAGNOSIS — H2513 Age-related nuclear cataract, bilateral: Secondary | ICD-10-CM | POA: Diagnosis not present

## 2024-02-15 DIAGNOSIS — H52223 Regular astigmatism, bilateral: Secondary | ICD-10-CM | POA: Diagnosis not present

## 2024-02-15 DIAGNOSIS — H5203 Hypermetropia, bilateral: Secondary | ICD-10-CM | POA: Diagnosis not present

## 2024-02-15 DIAGNOSIS — H524 Presbyopia: Secondary | ICD-10-CM | POA: Diagnosis not present

## 2024-02-15 DIAGNOSIS — H401121 Primary open-angle glaucoma, left eye, mild stage: Secondary | ICD-10-CM | POA: Diagnosis not present

## 2024-04-13 DIAGNOSIS — Z0000 Encounter for general adult medical examination without abnormal findings: Secondary | ICD-10-CM | POA: Diagnosis not present

## 2024-04-13 DIAGNOSIS — E785 Hyperlipidemia, unspecified: Secondary | ICD-10-CM | POA: Diagnosis not present

## 2024-04-13 DIAGNOSIS — I2510 Atherosclerotic heart disease of native coronary artery without angina pectoris: Secondary | ICD-10-CM | POA: Diagnosis not present

## 2024-04-13 DIAGNOSIS — Z08 Encounter for follow-up examination after completed treatment for malignant neoplasm: Secondary | ICD-10-CM | POA: Diagnosis not present

## 2024-04-13 DIAGNOSIS — I5022 Chronic systolic (congestive) heart failure: Secondary | ICD-10-CM | POA: Diagnosis not present

## 2024-04-13 DIAGNOSIS — I255 Ischemic cardiomyopathy: Secondary | ICD-10-CM | POA: Diagnosis not present

## 2024-04-13 DIAGNOSIS — Z79899 Other long term (current) drug therapy: Secondary | ICD-10-CM | POA: Diagnosis not present

## 2024-04-13 DIAGNOSIS — Z8546 Personal history of malignant neoplasm of prostate: Secondary | ICD-10-CM | POA: Diagnosis not present

## 2024-04-13 DIAGNOSIS — Z1331 Encounter for screening for depression: Secondary | ICD-10-CM | POA: Diagnosis not present

## 2024-05-13 DIAGNOSIS — H401121 Primary open-angle glaucoma, left eye, mild stage: Secondary | ICD-10-CM | POA: Diagnosis not present

## 2024-05-13 DIAGNOSIS — H401112 Primary open-angle glaucoma, right eye, moderate stage: Secondary | ICD-10-CM | POA: Diagnosis not present

## 2024-05-19 DIAGNOSIS — H401121 Primary open-angle glaucoma, left eye, mild stage: Secondary | ICD-10-CM | POA: Diagnosis not present

## 2024-05-19 DIAGNOSIS — H401112 Primary open-angle glaucoma, right eye, moderate stage: Secondary | ICD-10-CM | POA: Diagnosis not present

## 2024-12-01 ENCOUNTER — Ambulatory Visit (HOSPITAL_BASED_OUTPATIENT_CLINIC_OR_DEPARTMENT_OTHER): Admitting: Nurse Practitioner
